# Patient Record
Sex: Male | Born: 1950 | Race: White | Hispanic: No | Marital: Single | Smoking: Never smoker
Health system: Southern US, Community
[De-identification: ages and names within clinical notes are randomized; demographics above are authoritative.]

## PROBLEM LIST (undated history)

## (undated) DIAGNOSIS — Z87442 Personal history of urinary calculi: Secondary | ICD-10-CM

## (undated) DIAGNOSIS — H9190 Unspecified hearing loss, unspecified ear: Secondary | ICD-10-CM

## (undated) DIAGNOSIS — I10 Essential (primary) hypertension: Secondary | ICD-10-CM

## (undated) DIAGNOSIS — G4730 Sleep apnea, unspecified: Secondary | ICD-10-CM

## (undated) DIAGNOSIS — E785 Hyperlipidemia, unspecified: Secondary | ICD-10-CM

## (undated) DIAGNOSIS — T4145XA Adverse effect of unspecified anesthetic, initial encounter: Secondary | ICD-10-CM

## (undated) DIAGNOSIS — M1990 Unspecified osteoarthritis, unspecified site: Secondary | ICD-10-CM

## (undated) DIAGNOSIS — T8859XA Other complications of anesthesia, initial encounter: Secondary | ICD-10-CM

## (undated) HISTORY — PX: HAND SURGERY: SHX662

## (undated) HISTORY — PX: KIDNEY STONE SURGERY: SHX686

---

## 1998-02-03 ENCOUNTER — Emergency Department (HOSPITAL_COMMUNITY): Admission: EM | Admit: 1998-02-03 | Discharge: 1998-02-03 | Payer: Self-pay | Admitting: Emergency Medicine

## 1998-02-15 ENCOUNTER — Ambulatory Visit (HOSPITAL_COMMUNITY): Admission: RE | Admit: 1998-02-15 | Discharge: 1998-02-15 | Payer: Self-pay | Admitting: Urology

## 2002-06-22 ENCOUNTER — Encounter (INDEPENDENT_AMBULATORY_CARE_PROVIDER_SITE_OTHER): Payer: Self-pay | Admitting: *Deleted

## 2002-06-22 ENCOUNTER — Ambulatory Visit (HOSPITAL_COMMUNITY): Admission: RE | Admit: 2002-06-22 | Discharge: 2002-06-22 | Payer: Self-pay | Admitting: Gastroenterology

## 2004-08-09 ENCOUNTER — Inpatient Hospital Stay (HOSPITAL_COMMUNITY): Admission: AD | Admit: 2004-08-09 | Discharge: 2004-08-12 | Payer: Self-pay | Admitting: *Deleted

## 2005-05-30 ENCOUNTER — Encounter: Admission: RE | Admit: 2005-05-30 | Discharge: 2005-05-30 | Payer: Self-pay | Admitting: Internal Medicine

## 2008-11-24 ENCOUNTER — Encounter: Admission: RE | Admit: 2008-11-24 | Discharge: 2008-11-24 | Payer: Self-pay | Admitting: Internal Medicine

## 2008-12-02 ENCOUNTER — Emergency Department (HOSPITAL_BASED_OUTPATIENT_CLINIC_OR_DEPARTMENT_OTHER): Admission: EM | Admit: 2008-12-02 | Discharge: 2008-12-02 | Payer: Self-pay | Admitting: Emergency Medicine

## 2008-12-02 ENCOUNTER — Ambulatory Visit: Payer: Self-pay | Admitting: Radiology

## 2008-12-06 ENCOUNTER — Encounter: Admission: RE | Admit: 2008-12-06 | Discharge: 2008-12-06 | Payer: Self-pay | Admitting: Urology

## 2009-12-04 IMAGING — CR DG ABDOMEN ACUTE W/ 1V CHEST
3 series · 3 of 3 positions shown · non-contrast
Comparison: No comparison chest x-ray.  Comparison abdominal and
pelvic CT 11/24/2008.

CLINICAL DATA: Abdominal pain.  Constipation.

ACUTE ABDOMEN SERIES (ABDOMEN 2 VIEW & CHEST 1 VIEW)

[w chest pa]
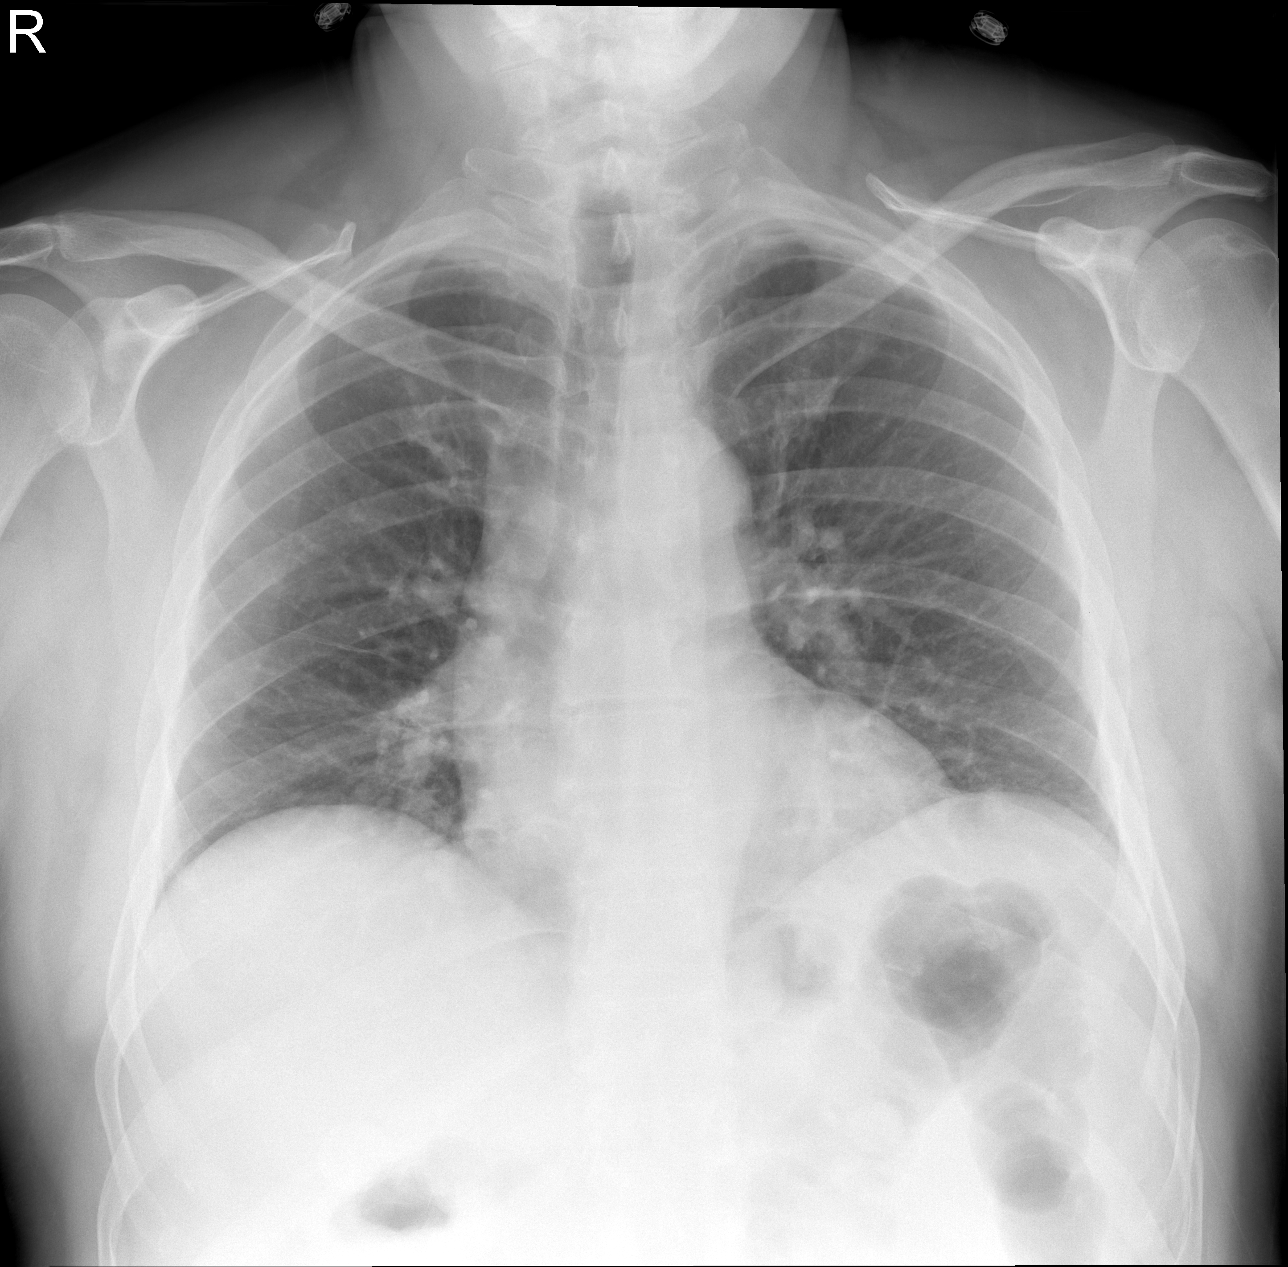

[w abdomen upright]
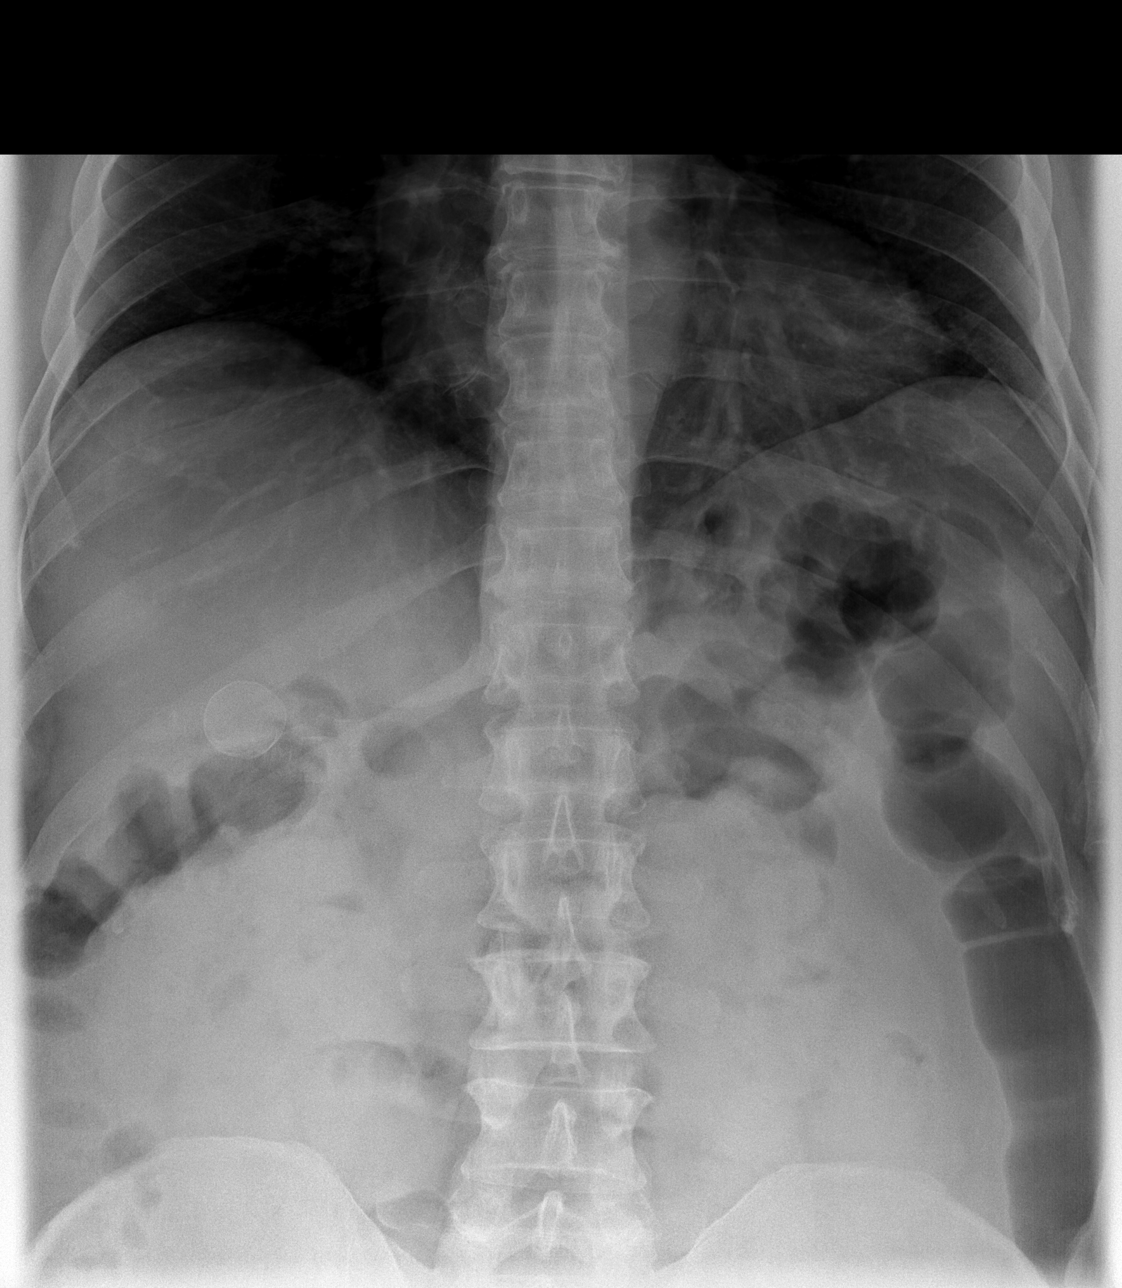

[t abdomen supine]
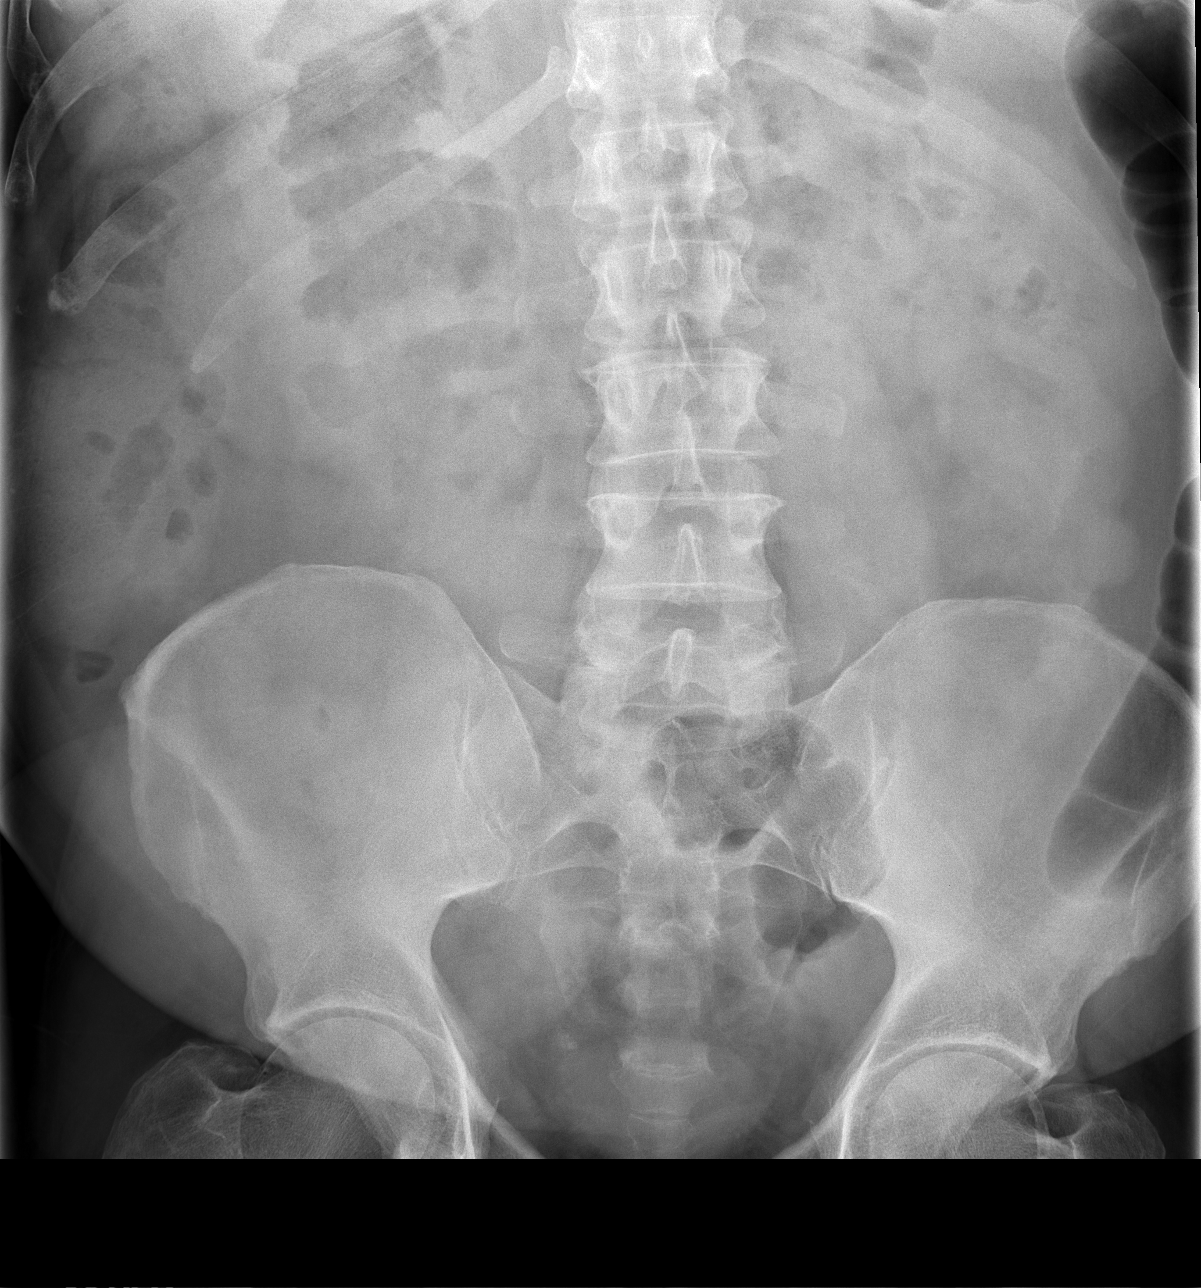

[3 of 3 positions shown; findings below may reference images not displayed]

FINDINGS: Poor inspiration with central pulmonary vascular
congestion.  Mild biapical pleural thickening (slightly greater on
the right) without associated bony destruction.  Stability can be
confirmed on follow-up.  No pneumothorax or segmental region
consolidation.  Heart size top normal.  Mildly tortuous aorta.  No
plain film evidence of mediastinal adenopathy.

2. 7 cm calcification upper quadrant suspicious for gallstone.
Nonspecific bowel gas pattern without plain film evidence of bowel
obstruction or free intraperitoneal air.  The full extent of the
abdomen is not included on the present exam.
IMPRESSION: The full extent of the abdomen is not included on the supine view.
No plain film evidence of obstruction or free intraperitoneal air.

2.7 cm gallstone suspected.

Poor inspiration with mild pulmonary vascular prominence.

Probable scarring lung apices as noted above.

## 2009-12-08 IMAGING — US US ABDOMEN COMPLETE
1 series · 14 of 25 positions shown · non-contrast
Comparison: CT abdomen 11/24/2008

CLINICAL DATA: Right upper quadrant pain, gallstones

ABDOMEN ULTRASOUND
TECHNIQUE: Complete abdominal ultrasound examination was performed
including evaluation of the liver, gallbladder, bile ducts,
pancreas, kidneys, spleen, IVC, and abdominal aorta.

[Series 1: us abdomen complete · 0.35mm/px · 14 of 72 slices shown]
[im 1/72]
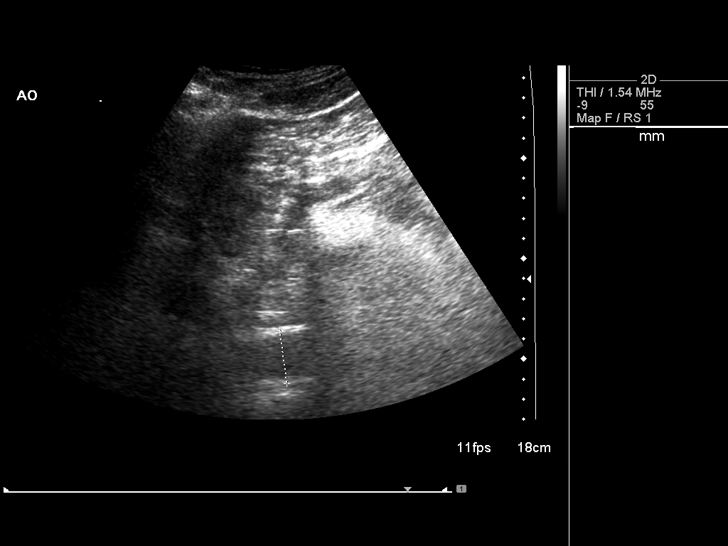
[im 6/72]
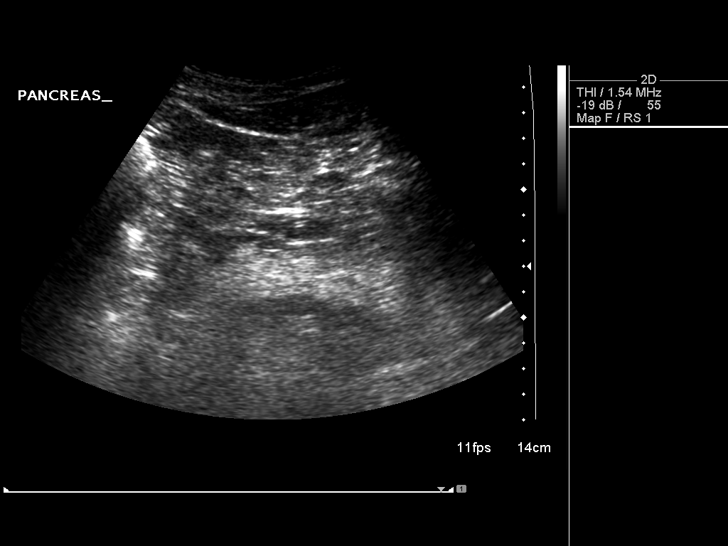
[im 12/72]
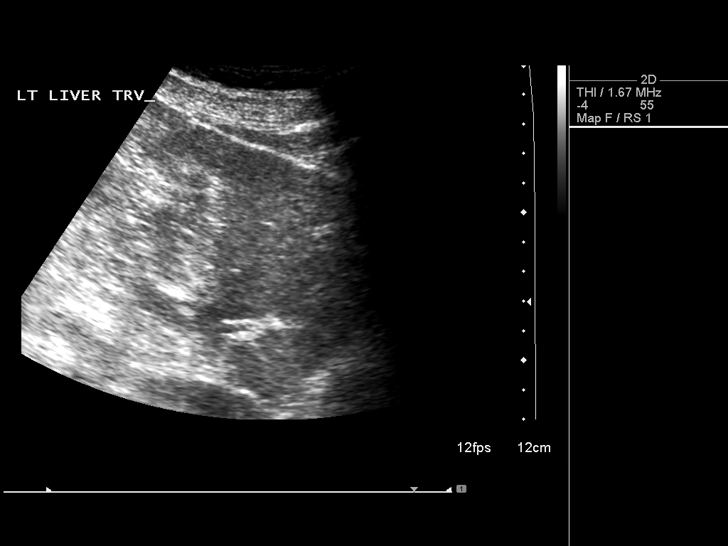
[im 18/72]
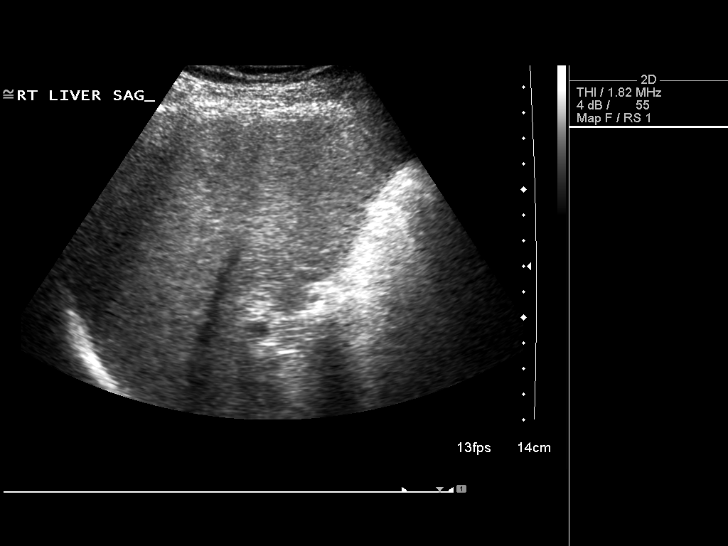
[im 24/72]
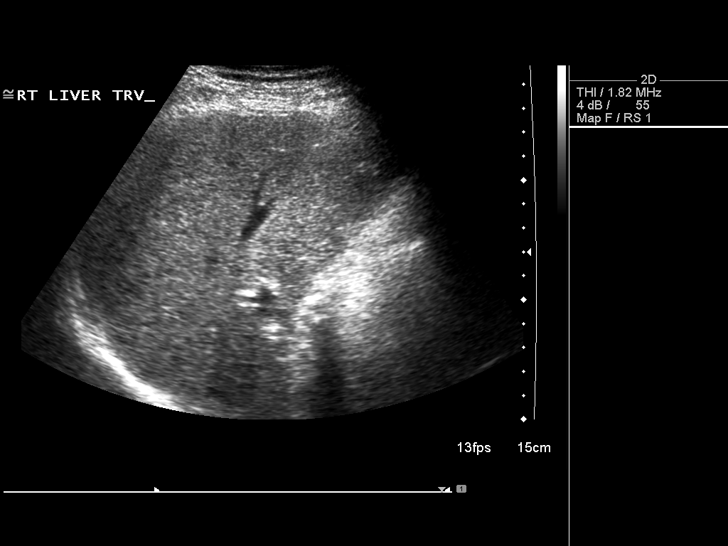
[im 27/72]
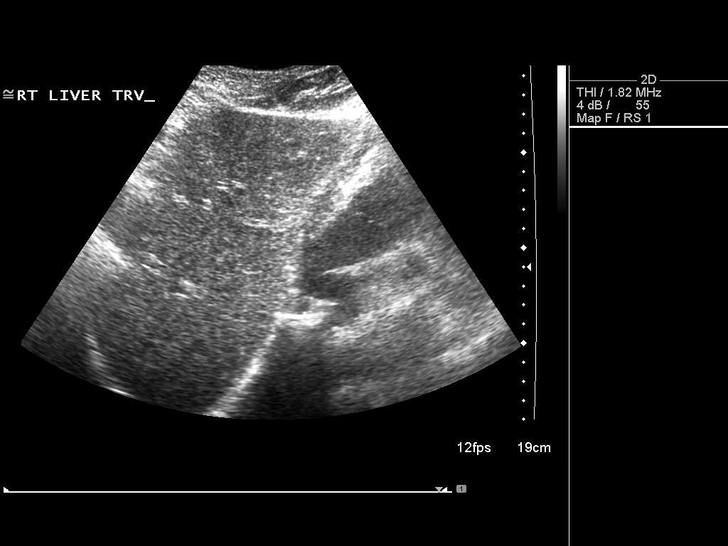
[im 33/72]
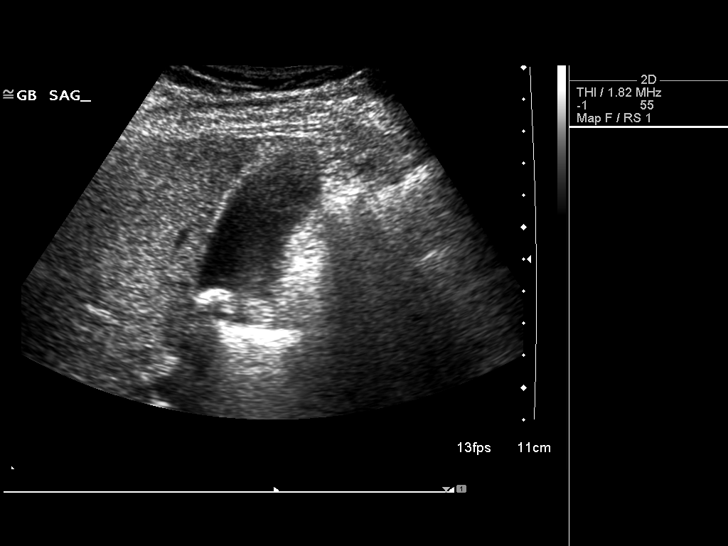
[im 39/72]
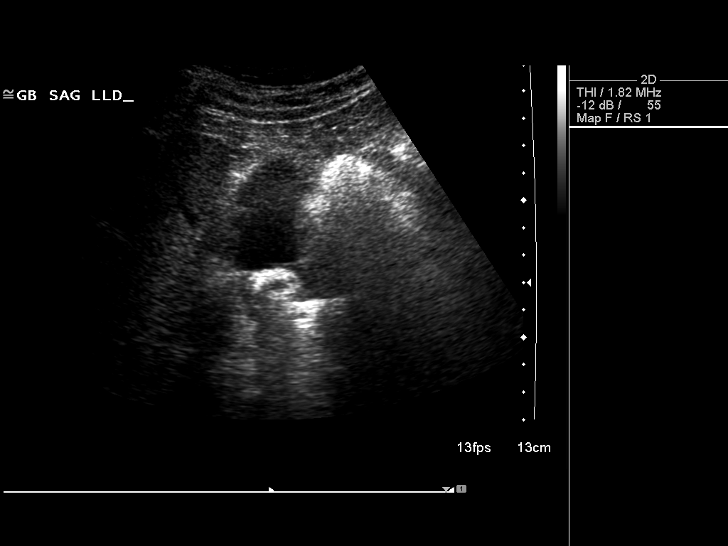
[im 45/72]
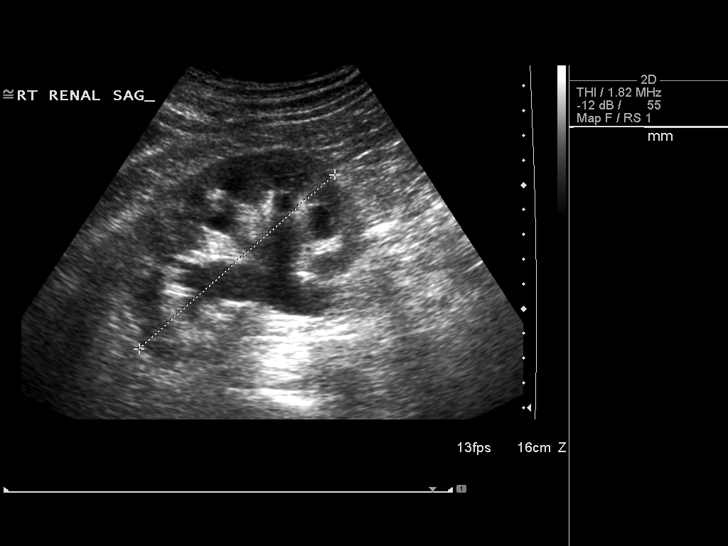
[im 48/72]
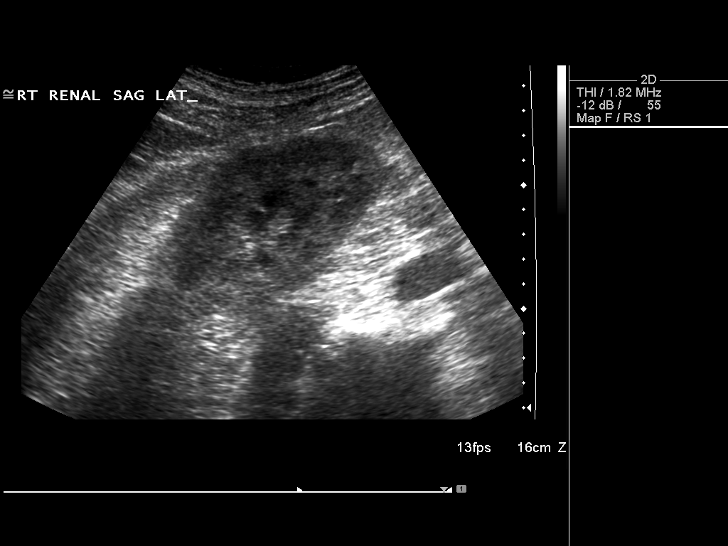
[im 54/72]
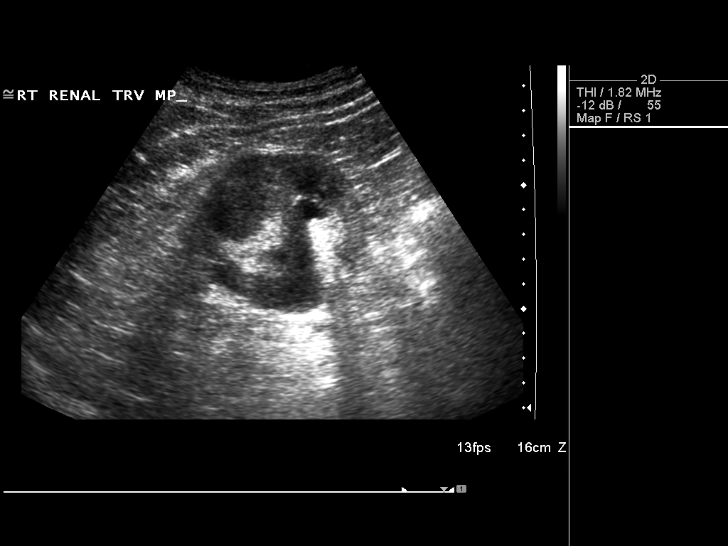
[im 60/72]
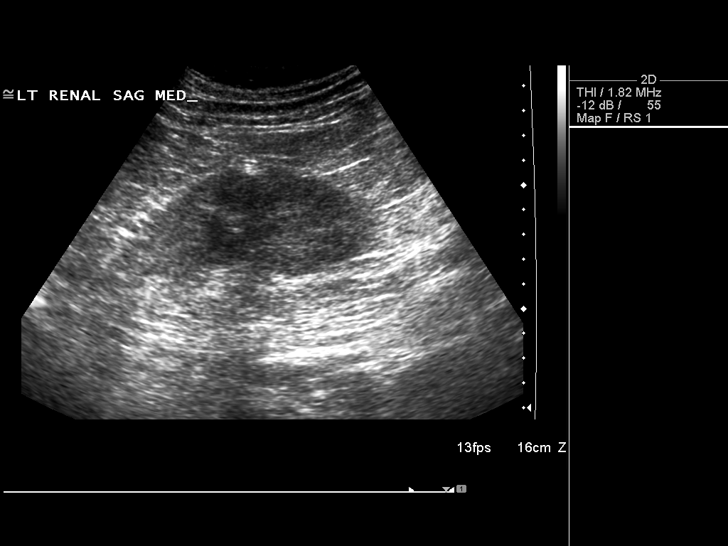
[im 66/72]
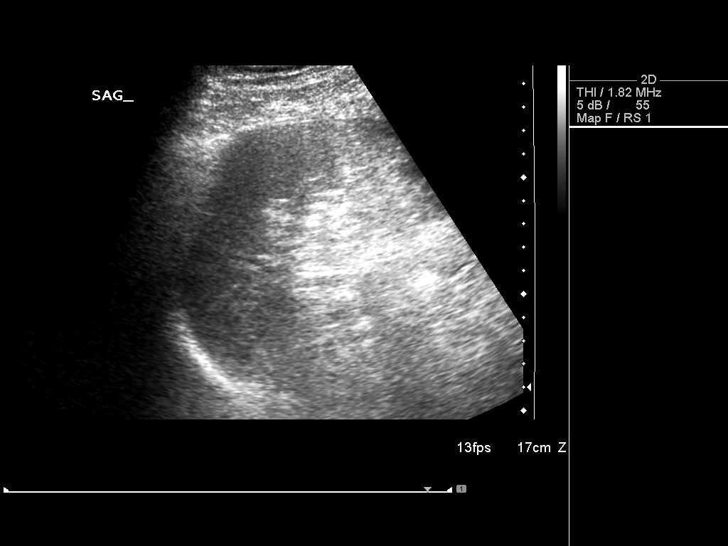
[im 72/72]
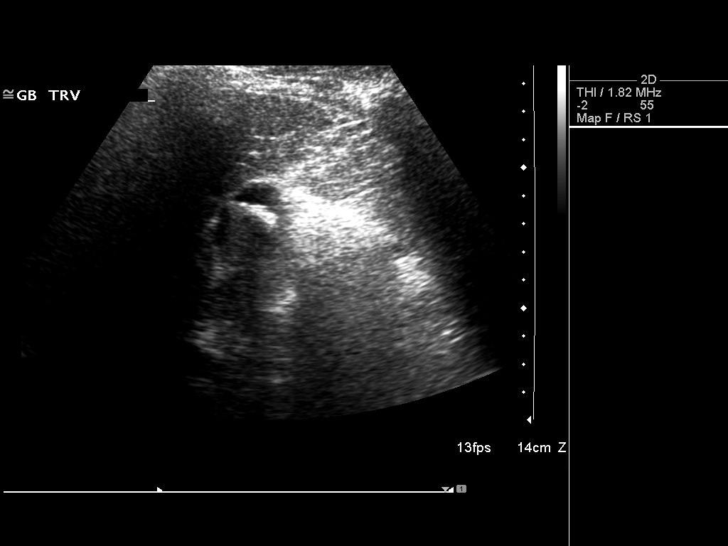

[14 of 25 positions shown; findings below may reference images not displayed]

FINDINGS: The liver is normal and contour and echogenicity.  There
are several (3 to 5) echogenic gallstone within the gallbladder,
the largest measuring 1.2 cm.  No evidence of gallbladder wall
thickening or pericholecystic fluid.  Negative sonographic Murphy's
sign.  Common bile duct is normal at 1.8 mm.

The IVC and pancreas appear normal.

The spleen is normal 5.7 cm.

There is  moderate hydronephrosis of the right kidney which is new.
Right kidney measures 11.8 cm.  No obstructing stone is identified.
The left kidney is non hydronephrotic and measures 12.4 cm.

Abdominal aorta is normal 2.7 cm.
IMPRESSION: 1. Cholelithiasis without evidence of acute cholecystitis.
2.  Hydronephrosis of the right kidney is new from comparison CT of
11/24/2008.  A 3mm right ureteral stone was described on comparison
CT.

REF:G3 DICTATED: 12/06/2008 [DATE]

## 2010-11-24 ENCOUNTER — Encounter: Payer: Self-pay | Admitting: Internal Medicine

## 2011-03-21 NOTE — H&P (Signed)
NAMEJONATHAN, CORPUS               ACCOUNT NO.:  [id number]   MEDICAL RECORD NO.:  [id number]          PATIENT TYPE:  INP   LOCATION:  5703                         FACILITY:  [facility]   PHYSICIAN:  Isidor Holts, M.D.  DATE OF BIRTH:  [birth year]   DATE OF ADMISSION:  08/09/2004  DATE OF DISCHARGE:                                HISTORY & PHYSICAL   PRIMARY PHYSICIAN:  Juline Patch, M.D.   CHIEF COMPLAINT:  Pain, redness, swelling right arm approximately 3 days.   HISTORY OF PRESENT ILLNESS:  As above.   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  In his line of work, he frequently comes in contact with  rusty/dirty cars.  He states he had paper cut right thumb on August 05, 2004  and also nicked his fifth finger of the right hand on the same day.  He  applied Neosporin cream when he noticed some redness in the affected areas.  By August 07, 2004 he noticed pus draining from the fifth finger wound.  He squeezed this and soaked hand in salt water, applied bandage.  Later that  evening his hand started hurting, could not gear shift, then wrist and  forearm started swelling with redness.  He called his PMD on August 08, 2004, was started on p.o. Augmentin.  On the night of August 08, 2004 the  affected arm became very painful, he could not sleep.  He went back to see  his PMD on August 09, 2004 who immediately referred him for admission.   PAST MEDICAL HISTORY:  1.  Hypertension, 7-8 years ago.  2.  Dyslipidemia, 2 years.  3.  Bilateral urolithiasis status post basket extraction about 5 years ago,      occasionally passes stones.   MEDICATION HISTORY:  1.  Augmentin, started August 08, 2004 - now discontinued.  2.  Cardura 8 mg p.o. daily.  3.  Aspirin 81 mg p.o. daily.  4.  Crestor 5 mg alternate days.   ALLERGIES:  No known drug allergies.   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  Denies  alcohol use or drug abuse.  He has four offspring, all of whom are alive and  well.  Mother is age 19 years with dementia, CHF, hypertension.  Father is  deceased at age 69 years with hypertension, heart disease, diabetes  mellitus, and colon cancer.  He had two brothers.  One is deceased from  ruptured abdominal aortic aneurysm.  Another is hypertensive.  He has four  sisters who are alive and well.   REVIEW OF SYMPTOMS:  Essentially as in chief complaint.  Otherwise,  negative.   PHYSICAL EXAMINATION:  VITAL SIGNS:  Temperature 98.2; pulse 76 per minute,  regular; respiratory rate 20; blood pressure 142/78 mmHg.  The patient  appears comfortable and not short of breath at rest.  HEENT:  No clinical pallor, no jaundice, no conjunctival injection.  NECK:  Supple, JVD  not seen.  There is no palpable lymphadenopathy, no  palpable goiter, no carotid bruits.  CHEST:  Clinically clear to auscultation.  No wheezes, no crackles.  HEART:  Heart sounds 1 and 2 are heard, normal rate and rhythm without any  murmurs.  ABDOMEN:  The patient has a small umbilical hernia.  Moderate obesity is  noted.  There is no palpable organomegaly, no palpable masses.  Abdomen is  nontender and bowel sounds are normal.  EXTREMITIES:  Lower extremity exam shows no pitting edema, palpable  peripheral pulses.  Musculoskeletal system full range of motion all joints  without joint swelling, deformity, or tenderness noted.  Left upper  extremity examination is absolutely unremarkable.  Right upper extremity  examination:  The patient has punctate wound dorsum of the right fifth digit  with digital swelling and peripheral erythema.  Right thumb is swollen.  Right hand is swollen with dorsal edema, and he also has lymphangitis of the  right forearm.  He is unable to grip with the right hand.  CENTRAL NERVOUS SYSTEM:  Alert and oriented x3.  No focal  neurologic deficit  noted on examination.   Investigations are still pending; however, CBC, CMP, and blood cultures have  been ordered.   ASSESSMENT AND PLAN:  1.  Cellulitis with lymphangitis right arm.  The main concern is whether or      not this patient has also developed a right hand abscess.  We shall      admit to general medical floor; start intravenous Zosyn and analgesics;      do an x-ray to rule out bony involvement; do CBC, blood cultures,      comprehensive metabolic panel; elevate his hand; and call hand surgery      on consult.  2.  Hypertension.  This seems under fairly good control.  Will continue his      preadmission medications and monitor.  3.  History of dyslipidemia.  Will hold Crestor for now and do fasting      lipids in the morning.   Further management will depend on clinical course.      Chri   CO/MEDQ  D:  08/09/2004  T:  08/09/2004  Job:  [id number]   cc:   Juline Patch, M.D.  [address]  [place], [place] [address]  Fax: [phone]

## 2011-03-21 NOTE — Discharge Summary (Signed)
[name], [name]               ACCOUNT NO.:  [id number]   MEDICAL RECORD NO.:  [id number]          PATIENT TYPE:  INP   LOCATION:  5741                         FACILITY:  [facility]   PHYSICIAN:  Mallory Shirk, MD     DATE OF BIRTH:  1951-09-08   DATE OF ADMISSION:  08/09/2004  DATE OF DISCHARGE:  08/12/2004                                 DISCHARGE SUMMARY   CHIEF COMPLAINT:  Pain/redness/swelling of right arm, approximately 3 days.   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  He had a paper cut on his right thumb, August 04, 2004,  also nicked the finger of the right hand on the same day.  He applied some  Neosporin cream and he noted some redness in the affected areas.  By August 07, 2004, he had noticed pus draining from the little finger wound.  He  squeezed this and soaked the hand in salt water, and applied a bandage.  Later that evening, his hand started hurting, could not drive his car and  could not shift the gears, and then his wrist and forearm started swelling  with associated redness.  He called his PCP, Dr. Juline Patch, on August 08, 2004, was started on p.o. Augmentin on the night of June 08, 2004.  The  affected arm became very painful and he could not sleep.  He went back to  see his PMD on August 09, 2004, who then referred him for admission.   PAST MEDICAL HISTORY:   MEDICAL:  1.  Hypertension.  2.  Dyslipidemia.  3.  Bilateral urolithiasis, status post basket extraction.   MEDICATIONS:  1.  Cardura 8 mg p.o. daily.  2.  Aspirin 81 mg p.o. daily.  3.  Crestor 5 mg p.o. every other day.   PHYSICAL EXAM ON ADMISSION:  VITAL SIGNS:  Temperature 98.2, pulse 76,  respirations 20, blood pressure 142/78.  GENERAL:  The patient appeared comfortable, in no acute discomfort.  HEENT:  Normocephalic, atraumatic.  PERRL.  Sclerae  anicteric.  NECK:  Neck supple and no JVD, no lymphadenopathy.  CVS:  S1 plus S2, regular rate and rhythm; no murmurs, rubs, or gallops.  LUNGS:  Lungs clear to auscultation bilaterally.  ABDOMEN:  Small midline umbilical hernia, moderate obesity noted, no  palpable organomegaly, positive bowel sounds, no tenderness.  EXTREMITIES:  Lower extremities:  No pitting edema.  Palpable peripheral  pulses.  Full range of motion in musculoskeletal system, except left upper  extremity which showed a punctate wound on the dorsum of the right 5th digit  with digital swelling, peripheral erythema.  The right thumb was swollen  with dorsal edema, also appeared to have lymphangitis of the right forearm,  unable to grip with the right forearm.  NEUROLOGIC:  Alert and oriented x3, no focal deficits.   LABORATORY DATA:  Labs on day of admission:  WBC 18.7, hemoglobin 13.6,  hematocrit 38.6, platelets 236,000.  BUN 132, potassium 3.8, chloride 98,  carbon  dioxide 26, glucose 149, BUN 14, creatinine 1.0, total bilirubin 1.3,  alkaline phosphatase 68, AST 14, ALT 16, total protein 6.8, albumin 3.4,  calcium 8.8, serum uric acid 4.6.  Lipid profile;  Cholesterol 106,  triglycerides 85, HDL cholesterol 37, LDL cholesterol 52.   IMAGING:  Plain films of the right upper extremity show:  #1 - Diffuse soft  tissue swelling and no radiographic evidence of osteomyelitis, #2 - mild  osteoarthritis noted.   HOSPITAL COURSE:  The patient was admitted to the floor.  He was treated  with IV vancomycin.  He was also started with Zosyn, which was subsequently  discontinued and Ancef was started.  He was seen by orthopedics and  underwent an I&D on his flexor sheath/palm, ulnar bursa at the wrist.  An  irrigation catheter drain was also placed.  The patient tolerated the  procedure well.  On postop day #1, his pain had decreased.  His swelling had  gone down on postop day #2, which was also the day of discharge.  The   patient's pain had significantly decreased and the swelling had also  decreased.  The patient was evaluated by orthopedics and recommended that we  discharge him.  The patient was discharged in stable condition, afebrile,  vital signs stable.   MEDICATIONS ON DISCHARGE:  1.  Augmentin 875 mg p.o. b.i.d. x10 days.  2.  Percocet 1-2 tablets p.o. q.4-6 h. p.r.n. pain.  3.  Advil/Aleve for pain.  4.  Cardura 8 mg p.o. daily.  5.  Aspirin 81 mg p.o. daily.  6.  Crestor 5 mg p.o. every other day.   FOLLOWUP APPOINTMENTS:  1.  With orthopedics on Wednesday, August 14, 2004, for dressing change.  2.  With primary care doctor, Dr. Juline Patch, within 2 weeks of discharge.      The patient will call and make the appointments for Dr. Ricki Miller.   ACTIVITY:  The patient was advised to limit his activities on an as-  tolerated basis.   SPECIAL DISCHARGE INSTRUCTIONS:  The patient was also advised to return to  the emergency room immediately upon onset of fever, shortness of breath,  pain in his right extremity, drainage from his right extremity incision or  any other symptom that may need immediate particular attention.       GDK/MEDQ  D:  08/12/2004  T:  08/13/2004  Job:  [id number]   cc:   Juline Patch, M.D.  [address]  [place], [place] [address]  Fax: [phone]

## 2011-03-21 NOTE — Op Note (Signed)
NAMEJERRAL, MCCAULEY               ACCOUNT NO.:  [id number]   MEDICAL RECORD NO.:  [id number]          PATIENT TYPE:  INP   LOCATION:  5741                         FACILITY:  [facility]   PHYSICIAN:  Katy Fitch. Sypher Montez Hageman., M.D.DATE OF BIRTH:  12-14-1950   DATE OF PROCEDURE:  DATE OF DISCHARGE:                                 OPERATIVE REPORT   DATE OF OPERATION:  August 10, 2004.   PREOPERATIVE DIAGNOSES:  Cellulitis, right hand, wrist, and forearm, status  post skin injury, dorsal aspect, right small finger DIP joint sustained  August 05, 2004, with a progressive cellulitis unresponsive to oral  antibiotic therapy including Augmentin 875 mg p.o. b.i.d. followed by poor  response to IV Vancomycin and Zosyn.  Rule out palmar space abscess and  ulnar bursa abscess due to septic flexor tenosynovitis of small finger.   POSTOPERATIVE DIAGNOSES:  1.  Cellulitis, right hand, wrist, and forearm, status post skin injury,      dorsal aspect, right small finger DIP joint sustained August 05, 2004,      with a progressive cellulitis unresponsive to oral antibiotic therapy      including Augmentin 875 mg p.o. b.i.d. followed by poor response to IV      Vancomycin and Zosyn.  Rule out palmar space abscess and ulnar bursa      abscess due to septic flexor tenosynovitis of small finger.  2.  Confirmation of palmar space abscess and ulnar bursa abscess with septic      flexor tenosynovitis demonstrated in ring and small finger flexor      sheaths.   OPERATION:  1.  Incision and drainage of right carpal canal and palmar space and ulnar      bursa with aerobic and anaerobic cultures.  2.  Incision and drainage of right small finger flexor sheath with through-      and-through irrigation with triple antibiotic solution utilizing  a #5      pediatric feeding tube.  3.  Incision and drainage of right ring finger flexor sheath with through-      and-through irrigation utilizing a #5 pediatric feeding tube  and triple      antibiotic solution followed by placement of vessel loop Silicone drains      along the flexor tendons of the ring and small finger through the palmar      space.   SURGEON:  Katy Fitch. Sypher, MD.   ASSISTANT:  None.   ANESTHESIA:  General by LMA.   SUPERVISING ANESTHESIOLOGIST:  Bedelia Person, MD.   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   On August 05, 2004, he sustained a cutaneous injury to the dorsal aspect of  his right small finger and another cutaneous injury on the palmar surface of  his right thumb.   He initially developed some rubor and swelling and began treating his hand  with saline soaks.  He presented for evaluation with his primary care  physician, Dr. Ricki Miller, on August 07, 2004, and was placed on  oral Augmentin.  He did not respond to oral antibiotic therapy and was seen back for  evaluation and management and ultimately admitted to [facility] early on  the morning of August 09, 2004, to the [facility].   He was noted to have a swollen right hand with volar forearm pain and  evidence of ascending cellulitis and lymphangitis.   Dr. Ricki Miller started IV Zosyn and requested a hand surgery consult.   Clinical examination initially showed diffuse cellulitis without evidence of  abscess formation.  Mr. Hylton initially had partial range of motion of his  fingers, thumb, and wrist.   He was followed closely for 48 hours and by his fifth clinical examination  was noted to have localizing symptoms in the small finger flexor sheath,  ring finger flexor sheath, and swelling along the ulnar aspect of his wrist  adjacent to the ulnar bursa consistent with a septic tenosynovitis.   Failing IV antibiotic therapy, I recommended proceeding with incision and  drainage at this time.   Serial examination had revealed progressive loss of sensibility in the  median  distribution confirming the increase in tension in the mid palmar  space and carpal canal.   After informed consent, he is brought to the operating room at this time.   PROCEDURE:  Cobi Delph is brought to the operating room and placed in the  supine position upon the operating table.   Following the induction of general anesthesia by LMA, the right arm was  prepped with Betadine soap and solution and sterilely draped.  A pneumatic  tourniquet was applied to the proximal brachium.   Following exsanguination initially by elevation, the arterial tourniquet was  inflated to 250 mmHg.  Procedure commenced with a carpal tunnel incision  paralleling the thenar crease, extended across the volar wrist flexion  creases with a Bruner's zig-zag incision.   Subcutaneous tissues revealed considerable edema.  The volar forearm fascia  was identified, and it was noted to be tense due to swelling of the flexor  tenosynovium.  The volar forearm fascia was released along the ulnar aspect  of the carpal canal and in the volar forearm exposing the ulnar bursa.  No  immediate collection of purulent material was noted.   Proximal dissection revealed no collections of pus in Parona's space.  Distal dissection with release of the transverse carpal ligament under  direct vision revealed a tense mid-palmar space, which immediately upon  entry produced a large quantity of turbid fluid consistent with  Streptococcal infection or mixed infection.   This was immediately cultured for aerobic and anaerobic growth.   The mid-palmar space was palpated digitally, and a blunt Glorious Peach was used to  break up adhesions followed by copious irrigation with triple antibiotic  solution.   The small finger flexor sheath was then exposed with a small, oblique  incision directly over the A-1 pulley.  The proximal portion of the A-1  pulley was released, and a #5 pediatric feeding tube was threaded distally deep to the A-2  pulley, and a second incision was fashioned at the DIP  flexion crease utilizing Bruner incision lines.  The interval between the A-  5 and A-4 pulleys was released, and through-and-through irrigation with a #5  pediatric feeding tube and triple antibiotic solution yielded a considerable  amount of fibrinous debris. Frank pus was not encountered; however, turbid  fluid was clearly evident.   Attention was then directed to the ring finger where an oblique incision  was  fashioned over the A-1 pulley.  Once again, the proximal margin of the A-1  pulley was released followed by threading of a pediatric feeding tube, and  distal drainage through an incision between the A-4 and A-5 pulleys.  Through-and-through irrigation once again cleared turbid fluid and fibrin.   The long finger did not appear to have significant distention of the flexor  sheath.   A Glorious Peach was then used to enter the palmar space and track along the path of  the flexor tendons to the long, ring, and small fingers to make sure no  areas of undrained, purulent material remained.   Thus, the loop drains were passed with a Carroll tendon passing forceps from  the level of the A-1 pulley into the palmar space for the ring and small  fingers to ensure drainage.   The wound was finally irrigated thoroughly with triple antibiotic solution  followed by partial closure of the volar forearm wound with intradermal 3-0  Prolene and palmar intradermal 3-0 Prolene that will be closed secondarily.  The finger incisions were left open.   The wounds were dressed with Adaptic, sterile gauze, sterile Kerlix, sterile  Webril, and a volar plaster splint immobilizing the wrist in neutral.  There  were no apparent complications with respect to Mr. Skillin.   A PS-2 needle stick occurred into my right thumb during closure.   We will ask for the routine protocol of needle stick precautions with  appropriate serologies following the [facility]  protocol for such incidents.   Otherwise, there were no incidents.       RVS/MEDQ  D:  08/10/2004  T:  08/11/2004  Job:  [id number]   cc:   Trevor Iha, Dr.   2 copies to Dr. Stark Jock office

## 2011-03-21 NOTE — Op Note (Signed)
   NAME:  [name], [name]                         ACCOUNT NO.:  [id number]   MEDICAL RECORD NO.:  [id number]                   PATIENT TYPE:  AMB   LOCATION:  ENDO                                 FACILITY:  [facility]   PHYSICIAN:  Charna Elizabeth, M.D.                   DATE OF BIRTH:  [birth year]   DATE OF PROCEDURE:  06/22/2002  DATE OF DISCHARGE:                                 OPERATIVE REPORT   PROCEDURE PERFORMED:  Colonoscopy with snare polypectomy x1.   ENDOSCOPIST:  Charna Elizabeth, M.D.   INSTRUMENT USED:  Olympus video colonoscope.   INDICATIONS FOR PROCEDURE:  The patient is a 60 year old white male with a  history of colon cancer in his father, rule out colonic polyps, masses,  hemorrhoids, etc.   PRE-PROCEDURE PREPARATION:  Informed consent was secured from the patient.  The patient fasted for eight hours prior to the procedure and prepped with a  bottle of magnesium citrate and 1 gallon of __________ the night prior to  the procedure.   PRE-PROCEDURE PHYSICAL EXAMINATION:  The patient had stable vital signs.  Neck was supple.  Chest was clear to auscultation.  S1 and S2 were regular.  Abdomen was soft with normal bowel sounds.   DESCRIPTION OF PROCEDURE:  The patient was placed in the left lateral  decubitus position and sedated with 70 mg of Demerol and 7 mg of Versed  intravenously.  Once the patient was adequately sedated, maintained on low-  flow oxygen and continuous cardiac monitoring, the Olympus video colonoscope  was advanced from the rectum to the cecum without difficulty except for a  small sessile polyp snared from 10 cm.  No other masses or polyps were seen  in the colon.  The procedure was concluded up to the cecum.  The appendiceal  orifice and the ileocecal valve were clearly visualized and photographed.  There was no evidence of diverticulosis, ulcers, or erosions.  Small  internal hemorrhoids were seen on retroflexion.   IMPRESSION:  1. Small polyp snared  at 10 cm.  2. Small, non-bleeding internal hemorrhoids.  3. Otherwise normal-appearing proximal left colon, transverse colon, right     colon and cecum.    RECOMMENDATIONS:  1. Await pathology results.  2. Avoid all non-steroidals for the next two weeks.  3. Outpatient followup in the next two weeks.                                               Charna Elizabeth, M.D.    JM/MEDQ  D:  06/22/2002  T:  06/25/2002  Job:  [id number]   cc:   Christiana Fuchs, MD  [address]  [place], [place] [address]  Fax: [phone]

## 2015-09-11 DIAGNOSIS — I10 Essential (primary) hypertension: Secondary | ICD-10-CM | POA: Diagnosis not present

## 2015-09-11 DIAGNOSIS — E782 Mixed hyperlipidemia: Secondary | ICD-10-CM | POA: Diagnosis not present

## 2015-09-18 DIAGNOSIS — Z23 Encounter for immunization: Secondary | ICD-10-CM | POA: Diagnosis not present

## 2015-09-18 DIAGNOSIS — G4733 Obstructive sleep apnea (adult) (pediatric): Secondary | ICD-10-CM | POA: Diagnosis not present

## 2015-09-18 DIAGNOSIS — I10 Essential (primary) hypertension: Secondary | ICD-10-CM | POA: Diagnosis not present

## 2015-09-18 DIAGNOSIS — E782 Mixed hyperlipidemia: Secondary | ICD-10-CM | POA: Diagnosis not present

## 2016-04-01 DIAGNOSIS — I10 Essential (primary) hypertension: Secondary | ICD-10-CM | POA: Diagnosis not present

## 2016-04-01 DIAGNOSIS — E782 Mixed hyperlipidemia: Secondary | ICD-10-CM | POA: Diagnosis not present

## 2016-04-01 DIAGNOSIS — G4733 Obstructive sleep apnea (adult) (pediatric): Secondary | ICD-10-CM | POA: Diagnosis not present

## 2016-04-01 DIAGNOSIS — Z125 Encounter for screening for malignant neoplasm of prostate: Secondary | ICD-10-CM | POA: Diagnosis not present

## 2016-04-02 DIAGNOSIS — Z0000 Encounter for general adult medical examination without abnormal findings: Secondary | ICD-10-CM | POA: Diagnosis not present

## 2016-04-08 DIAGNOSIS — G4733 Obstructive sleep apnea (adult) (pediatric): Secondary | ICD-10-CM | POA: Diagnosis not present

## 2016-04-08 DIAGNOSIS — R7303 Prediabetes: Secondary | ICD-10-CM | POA: Diagnosis not present

## 2016-04-08 DIAGNOSIS — I10 Essential (primary) hypertension: Secondary | ICD-10-CM | POA: Diagnosis not present

## 2016-04-08 DIAGNOSIS — E782 Mixed hyperlipidemia: Secondary | ICD-10-CM | POA: Diagnosis not present

## 2016-07-10 DIAGNOSIS — J329 Chronic sinusitis, unspecified: Secondary | ICD-10-CM | POA: Diagnosis not present

## 2016-07-10 DIAGNOSIS — Z23 Encounter for immunization: Secondary | ICD-10-CM | POA: Diagnosis not present

## 2016-07-22 DIAGNOSIS — J343 Hypertrophy of nasal turbinates: Secondary | ICD-10-CM | POA: Diagnosis not present

## 2016-07-22 DIAGNOSIS — J310 Chronic rhinitis: Secondary | ICD-10-CM | POA: Diagnosis not present

## 2016-07-22 DIAGNOSIS — J342 Deviated nasal septum: Secondary | ICD-10-CM | POA: Diagnosis not present

## 2016-08-13 ENCOUNTER — Other Ambulatory Visit (INDEPENDENT_AMBULATORY_CARE_PROVIDER_SITE_OTHER): Payer: Self-pay | Admitting: Otolaryngology

## 2016-08-13 DIAGNOSIS — J343 Hypertrophy of nasal turbinates: Secondary | ICD-10-CM | POA: Diagnosis not present

## 2016-08-13 DIAGNOSIS — J330 Polyp of nasal cavity: Secondary | ICD-10-CM | POA: Diagnosis not present

## 2016-08-13 DIAGNOSIS — J339 Nasal polyp, unspecified: Secondary | ICD-10-CM

## 2016-08-13 DIAGNOSIS — J310 Chronic rhinitis: Secondary | ICD-10-CM | POA: Diagnosis not present

## 2016-08-15 ENCOUNTER — Ambulatory Visit
Admission: RE | Admit: 2016-08-15 | Discharge: 2016-08-15 | Disposition: A | Discharge status: Home / Self Care | Payer: Medicare Other | Source: Ambulatory Visit | Attending: Otolaryngology | Admitting: Otolaryngology

## 2016-08-15 DIAGNOSIS — J3489 Other specified disorders of nose and nasal sinuses: Secondary | ICD-10-CM | POA: Diagnosis not present

## 2016-08-15 DIAGNOSIS — J339 Nasal polyp, unspecified: Secondary | ICD-10-CM

## 2016-09-03 DIAGNOSIS — J330 Polyp of nasal cavity: Secondary | ICD-10-CM | POA: Diagnosis not present

## 2016-09-03 DIAGNOSIS — J343 Hypertrophy of nasal turbinates: Secondary | ICD-10-CM | POA: Diagnosis not present

## 2016-09-03 DIAGNOSIS — J310 Chronic rhinitis: Secondary | ICD-10-CM | POA: Diagnosis not present

## 2016-09-12 ENCOUNTER — Other Ambulatory Visit: Payer: Self-pay | Admitting: Otolaryngology

## 2016-09-22 ENCOUNTER — Encounter (HOSPITAL_BASED_OUTPATIENT_CLINIC_OR_DEPARTMENT_OTHER): Payer: Self-pay | Admitting: *Deleted

## 2016-09-22 NOTE — Progress Notes (Signed)
   09/22/16 1532  OBSTRUCTIVE SLEEP APNEA  Have you ever been diagnosed with sleep apnea through a sleep study? Yes  If yes, do you have and use a CPAP or BPAP machine every night? 1  Do you know the presssure settings on your maching? Yes  Do you snore loudly (loud enough to be heard through closed doors)?  1  Do you often feel tired, fatigued, or sleepy during the daytime (such as falling asleep during driving or talking to someone)? 0  Has anyone observed you stop breathing during your sleep? 0  Do you have, or are you being treated for high blood pressure? 1  BMI more than 35 kg/m2? 0  Age > 50 (1-yes) 1  Neck circumference greater than:Male 16 inches or larger, Male 17inches or larger? 0  Male Gender (Yes=1) 1  Obstructive Sleep Apnea Score 4

## 2016-09-23 ENCOUNTER — Other Ambulatory Visit: Payer: Self-pay

## 2016-09-23 ENCOUNTER — Encounter (HOSPITAL_BASED_OUTPATIENT_CLINIC_OR_DEPARTMENT_OTHER)
Admission: RE | Admit: 2016-09-23 | Discharge: 2016-09-23 | Disposition: A | Discharge status: Home / Self Care | Payer: Medicare Other | Source: Ambulatory Visit | Attending: Otolaryngology | Admitting: Otolaryngology

## 2016-09-23 DIAGNOSIS — J310 Chronic rhinitis: Secondary | ICD-10-CM | POA: Diagnosis not present

## 2016-09-23 DIAGNOSIS — I10 Essential (primary) hypertension: Secondary | ICD-10-CM | POA: Insufficient documentation

## 2016-09-23 DIAGNOSIS — G4730 Sleep apnea, unspecified: Secondary | ICD-10-CM | POA: Diagnosis not present

## 2016-09-23 DIAGNOSIS — Z6834 Body mass index (BMI) 34.0-34.9, adult: Secondary | ICD-10-CM | POA: Diagnosis not present

## 2016-09-23 DIAGNOSIS — G4733 Obstructive sleep apnea (adult) (pediatric): Secondary | ICD-10-CM | POA: Insufficient documentation

## 2016-09-23 DIAGNOSIS — J338 Other polyp of sinus: Secondary | ICD-10-CM | POA: Diagnosis not present

## 2016-09-23 DIAGNOSIS — Z7982 Long term (current) use of aspirin: Secondary | ICD-10-CM | POA: Diagnosis not present

## 2016-09-23 DIAGNOSIS — E669 Obesity, unspecified: Secondary | ICD-10-CM | POA: Diagnosis not present

## 2016-09-23 DIAGNOSIS — M1990 Unspecified osteoarthritis, unspecified site: Secondary | ICD-10-CM | POA: Diagnosis not present

## 2016-09-23 DIAGNOSIS — J343 Hypertrophy of nasal turbinates: Secondary | ICD-10-CM | POA: Diagnosis not present

## 2016-09-23 DIAGNOSIS — J3489 Other specified disorders of nose and nasal sinuses: Secondary | ICD-10-CM | POA: Diagnosis not present

## 2016-09-23 LAB — BASIC METABOLIC PANEL
Anion gap: 8 (ref 5–15)
BUN: 14 mg/dL (ref 6–20)
CO2: 28 mmol/L (ref 22–32)
Calcium: 9.8 mg/dL (ref 8.9–10.3)
Chloride: 102 mmol/L (ref 101–111)
Creatinine, Ser: 1.09 mg/dL (ref 0.61–1.24)
GFR calc Af Amer: >60 mL/min (ref >60)
GFR calc non Af Amer: >60 mL/min (ref >60)
Glucose, Bld: 121 mg/dL — ABNORMAL HIGH (ref 65–99)
Potassium: 3.8 mmol/L (ref 3.5–5.1)
Sodium: 138 mmol/L (ref 135–145)

## 2016-09-30 ENCOUNTER — Encounter (HOSPITAL_BASED_OUTPATIENT_CLINIC_OR_DEPARTMENT_OTHER): Payer: Self-pay | Admitting: Anesthesiology

## 2016-09-30 ENCOUNTER — Ambulatory Visit (HOSPITAL_BASED_OUTPATIENT_CLINIC_OR_DEPARTMENT_OTHER)
Admission: RE | Admit: 2016-09-30 | Discharge: 2016-09-30 | Disposition: A | Discharge status: Home / Self Care | Payer: Medicare Other | Source: Ambulatory Visit | Attending: Otolaryngology | Admitting: Otolaryngology

## 2016-09-30 ENCOUNTER — Encounter (HOSPITAL_BASED_OUTPATIENT_CLINIC_OR_DEPARTMENT_OTHER)
Admission: RE | Disposition: A | Discharge status: Home / Self Care | Payer: Self-pay | Source: Ambulatory Visit | Attending: Otolaryngology

## 2016-09-30 ENCOUNTER — Ambulatory Visit (HOSPITAL_BASED_OUTPATIENT_CLINIC_OR_DEPARTMENT_OTHER): Payer: Medicare Other | Admitting: Anesthesiology

## 2016-09-30 DIAGNOSIS — J3489 Other specified disorders of nose and nasal sinuses: Secondary | ICD-10-CM | POA: Insufficient documentation

## 2016-09-30 DIAGNOSIS — Z7982 Long term (current) use of aspirin: Secondary | ICD-10-CM | POA: Diagnosis not present

## 2016-09-30 DIAGNOSIS — G4730 Sleep apnea, unspecified: Secondary | ICD-10-CM | POA: Insufficient documentation

## 2016-09-30 DIAGNOSIS — J338 Other polyp of sinus: Secondary | ICD-10-CM | POA: Diagnosis not present

## 2016-09-30 DIAGNOSIS — E669 Obesity, unspecified: Secondary | ICD-10-CM | POA: Insufficient documentation

## 2016-09-30 DIAGNOSIS — Z6834 Body mass index (BMI) 34.0-34.9, adult: Secondary | ICD-10-CM | POA: Insufficient documentation

## 2016-09-30 DIAGNOSIS — J330 Polyp of nasal cavity: Secondary | ICD-10-CM | POA: Diagnosis not present

## 2016-09-30 DIAGNOSIS — M1990 Unspecified osteoarthritis, unspecified site: Secondary | ICD-10-CM | POA: Insufficient documentation

## 2016-09-30 DIAGNOSIS — J310 Chronic rhinitis: Secondary | ICD-10-CM | POA: Diagnosis not present

## 2016-09-30 DIAGNOSIS — J343 Hypertrophy of nasal turbinates: Secondary | ICD-10-CM | POA: Diagnosis not present

## 2016-09-30 DIAGNOSIS — J339 Nasal polyp, unspecified: Secondary | ICD-10-CM | POA: Diagnosis not present

## 2016-09-30 DIAGNOSIS — I10 Essential (primary) hypertension: Secondary | ICD-10-CM | POA: Diagnosis not present

## 2016-09-30 HISTORY — DX: Unspecified hearing loss, unspecified ear: H91.90

## 2016-09-30 HISTORY — DX: Sleep apnea, unspecified: G47.30

## 2016-09-30 HISTORY — DX: Essential (primary) hypertension: I10

## 2016-09-30 HISTORY — PX: POLYPECTOMY: SHX149

## 2016-09-30 HISTORY — DX: Unspecified osteoarthritis, unspecified site: M19.90

## 2016-09-30 HISTORY — DX: Hyperlipidemia, unspecified: E78.5

## 2016-09-30 HISTORY — DX: Personal history of urinary calculi: Z87.442

## 2016-09-30 HISTORY — PX: NASAL SEPTOPLASTY W/ TURBINOPLASTY: SHX2070

## 2016-09-30 HISTORY — DX: Adverse effect of unspecified anesthetic, initial encounter: T41.45XA

## 2016-09-30 HISTORY — DX: Other complications of anesthesia, initial encounter: T88.59XA

## 2016-09-30 SURGERY — SEPTOPLASTY, NOSE, WITH NASAL TURBINATE REDUCTION
Anesthesia: General | Site: Nose | Laterality: Right

## 2016-09-30 MED ORDER — FENTANYL CITRATE (PF) 100 MCG/2ML IJ SOLN
INTRAMUSCULAR | Status: AC
  Filled 2016-09-30: qty 2

## 2016-09-30 MED ORDER — DEXAMETHASONE SODIUM PHOSPHATE 10 MG/ML IJ SOLN
INTRAMUSCULAR | Status: AC
  Filled 2016-09-30: qty 1

## 2016-09-30 MED ORDER — PROPOFOL 10 MG/ML IV BOLUS
INTRAVENOUS | Status: DC | PRN
  Administered 2016-09-30: 200 mg via INTRAVENOUS

## 2016-09-30 MED ORDER — OXYMETAZOLINE HCL 0.05 % NA SOLN
NASAL | Status: DC | PRN
  Administered 2016-09-30: 1

## 2016-09-30 MED ORDER — LACTATED RINGERS IV SOLN
INTRAVENOUS | Status: DC
  Administered 2016-09-30 (×2): via INTRAVENOUS

## 2016-09-30 MED ORDER — OXYCODONE HCL 5 MG PO TABS
ORAL_TABLET | ORAL | Status: AC
  Filled 2016-09-30: qty 1

## 2016-09-30 MED ORDER — ROCURONIUM BROMIDE 10 MG/ML (PF) SYRINGE
PREFILLED_SYRINGE | INTRAVENOUS | Status: AC
  Filled 2016-09-30: qty 10

## 2016-09-30 MED ORDER — SUGAMMADEX SODIUM 500 MG/5ML IV SOLN
INTRAVENOUS | Status: AC
  Filled 2016-09-30: qty 5

## 2016-09-30 MED ORDER — CEFAZOLIN SODIUM 1 G IJ SOLR
INTRAMUSCULAR | Status: AC
  Filled 2016-09-30: qty 20

## 2016-09-30 MED ORDER — FENTANYL CITRATE (PF) 100 MCG/2ML IJ SOLN
50.0000 ug | INTRAMUSCULAR | Status: DC | PRN
  Administered 2016-09-30: 100 ug via INTRAVENOUS

## 2016-09-30 MED ORDER — ONDANSETRON HCL 4 MG/2ML IJ SOLN
INTRAMUSCULAR | Status: DC | PRN
  Administered 2016-09-30: 4 mg via INTRAVENOUS

## 2016-09-30 MED ORDER — SCOPOLAMINE 1 MG/3DAYS TD PT72: 1.0000 | MEDICATED_PATCH | Freq: Once | TRANSDERMAL | Status: DC | PRN

## 2016-09-30 MED ORDER — LIDOCAINE 2% (20 MG/ML) 5 ML SYRINGE
INTRAMUSCULAR | Status: AC
  Filled 2016-09-30: qty 5

## 2016-09-30 MED ORDER — PROPOFOL 10 MG/ML IV BOLUS
INTRAVENOUS | Status: AC
  Filled 2016-09-30: qty 20

## 2016-09-30 MED ORDER — SUGAMMADEX SODIUM 500 MG/5ML IV SOLN
INTRAVENOUS | Status: DC | PRN
  Administered 2016-09-30: 300 mg via INTRAVENOUS

## 2016-09-30 MED ORDER — OXYCODONE-ACETAMINOPHEN 5-325 MG PO TABS: 1.0000 | ORAL_TABLET | ORAL | 0 refills | Status: AC | PRN

## 2016-09-30 MED ORDER — SUGAMMADEX SODIUM 200 MG/2ML IV SOLN
INTRAVENOUS | Status: AC
  Filled 2016-09-30: qty 2

## 2016-09-30 MED ORDER — MIDAZOLAM HCL 2 MG/2ML IJ SOLN
INTRAMUSCULAR | Status: AC
  Filled 2016-09-30: qty 2

## 2016-09-30 MED ORDER — ROCURONIUM BROMIDE 100 MG/10ML IV SOLN
INTRAVENOUS | Status: DC | PRN
  Administered 2016-09-30: 30 mg via INTRAVENOUS

## 2016-09-30 MED ORDER — ONDANSETRON HCL 4 MG/2ML IJ SOLN
INTRAMUSCULAR | Status: AC
  Filled 2016-09-30: qty 2

## 2016-09-30 MED ORDER — ARTIFICIAL TEARS OP OINT
TOPICAL_OINTMENT | OPHTHALMIC | Status: AC
  Filled 2016-09-30: qty 7

## 2016-09-30 MED ORDER — MIDAZOLAM HCL 2 MG/2ML IJ SOLN
1.0000 mg | INTRAMUSCULAR | Status: DC | PRN
  Administered 2016-09-30: 2 mg via INTRAVENOUS

## 2016-09-30 MED ORDER — FENTANYL CITRATE (PF) 100 MCG/2ML IJ SOLN
25.0000 ug | INTRAMUSCULAR | Status: DC | PRN
  Administered 2016-09-30: 50 ug via INTRAVENOUS

## 2016-09-30 MED ORDER — CEFAZOLIN SODIUM-DEXTROSE 2-3 GM-% IV SOLR
INTRAVENOUS | Status: DC | PRN
  Administered 2016-09-30: 2 g via INTRAVENOUS

## 2016-09-30 MED ORDER — SUCCINYLCHOLINE CHLORIDE 200 MG/10ML IV SOSY
PREFILLED_SYRINGE | INTRAVENOUS | Status: AC
  Filled 2016-09-30: qty 10

## 2016-09-30 MED ORDER — SUCCINYLCHOLINE CHLORIDE 20 MG/ML IJ SOLN
INTRAMUSCULAR | Status: DC | PRN
  Administered 2016-09-30: 120 mg via INTRAVENOUS

## 2016-09-30 MED ORDER — ONDANSETRON HCL 4 MG/2ML IJ SOLN: 4.0000 mg | Freq: Once | INTRAMUSCULAR | Status: DC | PRN

## 2016-09-30 MED ORDER — 0.9 % SODIUM CHLORIDE (POUR BTL) OPTIME
TOPICAL | Status: DC | PRN
  Administered 2016-09-30: 1000 mL

## 2016-09-30 MED ORDER — OXYCODONE HCL 5 MG PO TABS
5.0000 mg | ORAL_TABLET | Freq: Once | ORAL | Status: AC
  Administered 2016-09-30: 5 mg via ORAL

## 2016-09-30 MED ORDER — LIDOCAINE HCL (CARDIAC) 20 MG/ML IV SOLN
INTRAVENOUS | Status: DC | PRN
  Administered 2016-09-30: 50 mg via INTRAVENOUS

## 2016-09-30 MED ORDER — DEXAMETHASONE SODIUM PHOSPHATE 4 MG/ML IJ SOLN
INTRAMUSCULAR | Status: DC | PRN
  Administered 2016-09-30: 10 mg via INTRAVENOUS

## 2016-09-30 MED ORDER — AMOXICILLIN 875 MG PO TABS: 875.0000 mg | ORAL_TABLET | Freq: Two times a day (BID) | ORAL | 0 refills | Status: AC

## 2016-09-30 SURGICAL SUPPLY — 40 items
ATTRACTOMAT 16X20 MAGNETIC DRP (DRAPES) IMPLANT
BLADE SURG 15 STRL LF DISP TIS (BLADE) IMPLANT
BLADE SURG 15 STRL SS (BLADE)
CANISTER SUCT 1200ML W/VALVE (MISCELLANEOUS) ×4 IMPLANT
CATH SINUS GUIDE F-70 (CATHETERS) IMPLANT
COAGULATOR SUCT 6 FR SWTCH (ELECTROSURGICAL)
COAGULATOR SUCT 8FR VV (MISCELLANEOUS) ×4 IMPLANT
COAGULATOR SUCT SWTCH 10FR 6 (ELECTROSURGICAL) IMPLANT
DECANTER SPIKE VIAL GLASS SM (MISCELLANEOUS) IMPLANT
DRSG NASOPORE 8CM (GAUZE/BANDAGES/DRESSINGS) IMPLANT
DRSG TELFA 3X8 NADH (GAUZE/BANDAGES/DRESSINGS) IMPLANT
ELECT REM PT RETURN 9FT ADLT (ELECTROSURGICAL) ×4
ELECTRODE REM PT RTRN 9FT ADLT (ELECTROSURGICAL) ×2 IMPLANT
GLOVE BIO SURGEON STRL SZ7.5 (GLOVE) ×4 IMPLANT
GOWN STRL REUS W/ TWL LRG LVL3 (GOWN DISPOSABLE) ×4 IMPLANT
GOWN STRL REUS W/TWL LRG LVL3 (GOWN DISPOSABLE) ×8
IV SET EXT 30 76VOL 4 MALE LL (IV SETS) IMPLANT
MARKER SKIN DUAL TIP RULER LAB (MISCELLANEOUS) IMPLANT
NDL HYPO 25X1 1.5 SAFETY (NEEDLE) ×2 IMPLANT
NEEDLE HYPO 25X1 1.5 SAFETY (NEEDLE) ×4 IMPLANT
NS IRRIG 1000ML POUR BTL (IV SOLUTION) ×4 IMPLANT
PACK BASIN DAY SURGERY FS (CUSTOM PROCEDURE TRAY) ×4 IMPLANT
PACK ENT DAY SURGERY (CUSTOM PROCEDURE TRAY) ×4 IMPLANT
PAD DRESSING TELFA 3X8 NADH (GAUZE/BANDAGES/DRESSINGS) IMPLANT
SCRUB TECHNI CARE SURGICAL (MISCELLANEOUS) IMPLANT
SLEEVE SCD COMPRESS KNEE MED (MISCELLANEOUS) IMPLANT
SOLUTION BUTLER CLEAR DIP (MISCELLANEOUS) ×8 IMPLANT
SPLINT NASAL AIRWAY SILICONE (MISCELLANEOUS) IMPLANT
SPONGE GAUZE 2X2 8PLY STER LF (GAUZE/BANDAGES/DRESSINGS) ×1
SPONGE GAUZE 2X2 8PLY STRL LF (GAUZE/BANDAGES/DRESSINGS) ×3 IMPLANT
SPONGE NEURO XRAY DETECT 1X3 (DISPOSABLE) ×4 IMPLANT
SUT CHROMIC 4 0 P 3 18 (SUTURE) ×4 IMPLANT
SUT PLAIN 4 0 ~~LOC~~ 1 (SUTURE) ×4 IMPLANT
SUT PROLENE 3 0 PS 2 (SUTURE) ×4 IMPLANT
SUT VIC AB 4-0 P-3 18XBRD (SUTURE) IMPLANT
SUT VIC AB 4-0 P3 18 (SUTURE)
TOWEL OR 17X24 6PK STRL BLUE (TOWEL DISPOSABLE) ×4 IMPLANT
TUBE SALEM SUMP 12R W/ARV (TUBING) IMPLANT
TUBE SALEM SUMP 16 FR W/ARV (TUBING) ×4 IMPLANT
YANKAUER SUCT BULB TIP NO VENT (SUCTIONS) ×4 IMPLANT

## 2016-09-30 NOTE — Transfer of Care (Signed)
Immediate Anesthesia Transfer of Care Note  Patient: [name]  Procedure(s) Performed: Procedure(s) with comments: NASAL SEPTOPLASTY WITH TURBINATE REDUCTION (Bilateral) - NASAL SEPTOPLASTY WITH TURBINATE REDUCTION RIGHT POLYPECTOMY NASAL (Right) - RIGHT POLYPECTOMY NASAL  Patient Location: PACU  Anesthesia Type:General  Level of Consciousness: awake and alert   Airway & Oxygen Therapy: Patient Spontanous Breathing and Patient connected to face mask oxygen  Post-op Assessment: Report given to RN and Post -op Vital signs reviewed and stable  Post vital signs: Reviewed and stable  Last Vitals:  Vitals:   09/30/16 0953  BP: (!) 154/80  Pulse: 73  Resp: 18  Temp: 36.4 C    Last Pain:  Vitals:   09/30/16 0953  TempSrc: Oral  PainSc: 8       Patients Stated Pain Goal: 4 ([phone])  Complications: No apparent anesthesia complications

## 2016-09-30 NOTE — Anesthesia Postprocedure Evaluation (Signed)
Anesthesia Post Note  Patient: [name]  Procedure(s) Performed: Procedure(s) (LRB): NASAL SEPTOPLASTY WITH TURBINATE REDUCTION (Bilateral) RIGHT POLYPECTOMY NASAL (Right)  Patient location during evaluation: PACU Anesthesia Type: General Level of consciousness: awake and alert Pain management: pain level controlled Vital Signs Assessment: post-procedure vital signs reviewed and stable Respiratory status: spontaneous breathing, nonlabored ventilation, respiratory function stable and patient connected to nasal cannula oxygen Cardiovascular status: blood pressure returned to baseline and stable Postop Assessment: no signs of nausea or vomiting Anesthetic complications: no    Last Vitals:  Vitals:   09/30/16 1215 09/30/16 1230  BP: 126/83 130/74  Pulse: 72 70  Resp: 12 15  Temp:      Last Pain:  Vitals:   09/30/16 1230  TempSrc:   PainSc: 3                  Tiajuana Amass

## 2016-09-30 NOTE — H&P (Signed)
Cc: Chronic nasal obstruction  HPI: The patient is a 65 year old male who returns today for his follow-up evaluation.  The patient has a history of chronic nasal obstruction, secondary to nasal mucosal congestion, bilateral inferior turbinate hypertrophy and right nasal polyposis.  He was previously treated with systemic and topical steroids.  He is also on OTC decongestant and antihistamines.  He recently underwent a paranasal sinus CT scan.  The CT showed a large right posterior nasal cavity polyp, extending into the nasopharynx.  In addition, bilateral severe inferior turbinate hypertrophy was also noted.  The patient returns today reporting persistent bilateral nasal obstruction.  He denies any recent sinusitis.  He has no significant facial pain, nasal drainage, fever, or visual change.  Exam: The nasal cavities were decongested and anesthetised with a combination of oxymetazoline and 4% lidocaine solution.  The flexible scope was inserted into the right nasal cavity.  Endoscopy of the inferior and middle meatus was performed.  Moderately edematous mucosa was noted. A large polyp was noted within the right posterior nasal cavity, nearly completely obstructing the entire right nasal cavity.  Turbinates were hypertrophied but without mass.  Incomplete response to decongestion.  The procedure was repeated on the contralateral side.  No polyp on the left. The patient tolerated the procedure well.  Instructions were given to avoid eating or drinking for 2 hours.    Assessment: 1.  Persistent chronic rhinitis with nasal mucosal congestion, bilateral severe inferior turbinate hypertrophy, and right nasal polyposis.  2.  His right nasal polyp completely obstructs the right nasal cavity.  The polyp extends into the nasopharynx.    Plan: 1.  The nasal endoscopy findings and the CT images are reviewed with the patient.  2.  The patient should continue with his Flonase nasal spray and daily Zyrtec.  3.  In  light of his persistent symptoms, he will benefit from undergoing surgical intervention with removal of his right nasal polyps and partial resection of his bilateral inferior turbinates.   4.  The patient would like to proceed with the procedures.

## 2016-09-30 NOTE — Anesthesia Procedure Notes (Signed)
Procedure Name: Intubation Date/Time: 09/30/2016 10:31 AM Performed by: Lieutenant Diego Pre-anesthesia Checklist: Patient identified, Emergency Drugs available, Suction available and Patient being monitored Patient Re-evaluated:Patient Re-evaluated prior to inductionOxygen Delivery Method: Circle system utilized Preoxygenation: Pre-oxygenation with 100% oxygen Intubation Type: IV induction Ventilation: Mask ventilation without difficulty Laryngoscope Size: Miller and 2 Grade View: Grade II Tube type: Oral Number of attempts: 1 Airway Equipment and Method: Stylet and Oral airway Placement Confirmation: ETT inserted through vocal cords under direct vision,  positive ETCO2 and breath sounds checked- equal and bilateral Secured at: 24 cm Tube secured with: Tape Dental Injury: Teeth and Oropharynx as per pre-operative assessment  Difficulty Due To: Difficult Airway- due to anterior larynx and Difficult Airway- due to dentition

## 2016-09-30 NOTE — Op Note (Signed)
DATE OF PROCEDURE: 09/30/2016  OPERATIVE REPORT   SURGEON: Leta Baptist, MD   PREOPERATIVE DIAGNOSES:  1. Chronic nasal obstruction.  2. Bilateral inferior turbinate hypertrophy.  3. Right nasal polyps  POSTOPERATIVE DIAGNOSES:  1. Chronic nasal obstruction.  2. Bilateral inferior turbinate hypertrophy.  3. Right nasal polyps  PROCEDURE PERFORMED:  1. Bilateral partial inferior turbinate resection.  2. Endoscopic right nasal polypectomy  ANESTHESIA: General endotracheal tube anesthesia.   COMPLICATIONS: None.   ESTIMATED BLOOD LOSS: Minimal.   INDICATION FOR PROCEDURE :[name] is a 65 y.o. male with a history of chronic nasal obstruction. The patient was treated with antihistamine, decongestant, steroid nasal spray, and systemic steroids. However, the patient continues to be symptomatic. On examination, the patient was noted to have large nasal polyposis in the right nasal cavity and bilateral severe inferior turbinate hypertrophy, causing significant nasal obstruction. Based on the above findings, the decision was made for the patient to undergo the above-stated procedure. The risks, benefits, alternatives, and details of the procedure were discussed with the patient. Questions were invited and answered. Informed consent was obtained.   DESCRIPTION: The patient was taken to the operating room and placed supine on the operating table. General endotracheal tube anesthesia was administered by the anesthesiologist. The patient was positioned and prepped and draped in a standard fashion for nasal surgery. Pledgets soaked with Afrin were placed in both nasal cavities. The pledgets were subsequently removed. Examination of the nasal cavities revealed bilateral severe inferior turbinate hypertrophy and large polyps within the right nasal cavity.   Attention was first focused on the nasal polyps. Using a 0 endoscope, the right nasal cavity was examined. The nasal polyps were identified and  removed in a piecemeal fashion using a combination of microdebrider and Blakesley forceps. The specimens were sent to the pathology department for permanent histologic identification. All visible polyps were removed.  Attention was then focused on the inferior turbinates.The inferior one-half of each inferior turbinate was crossclamped with a straight Kelly clamp. The inferior one-half of each inferior turbinate was then resected with a pair of cross cutting scissors. Hemostasis was achieved with suction electrocautery, under direct visual guidance of the zero-degree endoscope. Good hemostasis was achieved. The care of the patient was turned over to the anesthesiologist. The patient was awakened from anesthesia without difficulty. The patient was extubated and transferred to the recovery room in good condition.   OPERATIVE FINDINGS: Bilateral inferior turbinate hypertrophy, right nasal polyps.  SPECIMEN: Right nasal polyps  FOLLOWUP CARE: The patient will be discharged home once he is awake and alert. The patient will be placed on Percocet. p.r.n. pain, and amoxicillin 875 mg p.o. b.i.d. for 5 days. The patient will follow up in my office in approximately 1 week.   Leta Baptist, MD

## 2016-09-30 NOTE — Discharge Instructions (Addendum)
POSTOPERATIVE INSTRUCTIONS FOR PATIENTS HAVING NASAL OR SINUS OPERATIONS ACTIVITY: Restrict activity at home for the first two days, resting as much as possible. Light activity is best. You may usually return to work within a week. You should refrain from nose blowing, strenuous activity, or heavy lifting greater than 20lbs for a total of three weeks after your operation.  If sneezing cannot be avoided, sneeze with your mouth open. DISCOMFORT: You may experience a dull headache and pressure along with nasal congestion and discharge. These symptoms may be worse during the first week after the operation but may last as long as two to four weeks.  Please take Tylenol or the pain medication that has been prescribed for you. Do not take aspirin or aspirin containing medications since they may cause bleeding.  You may experience symptoms of post nasal drainage, nasal congestion, headaches and fatigue for two or three months after your operation.  BLEEDING: You may have some blood tinged nasal drainage for approximately two weeks after the operation.  The discharge will be worse for the first week.  Please call our office at [phone] or go to the nearest hospital emergency room if you experience any of the following: heavy, bright red blood from your nose or mouth that lasts longer than ten minutes or coughing up or vomiting bright red blood or blood clots. GENERAL CONSIDERATIONS: 1. A gauze dressing will be placed on your upper lip to absorb any drainage after the operation. You may need to change this several times a day.  If you do not have very much drainage, you may remove the dressing.  Remember that you may gently wipe your nose with a tissue and sniff in, but DO NOT blow your nose. 2. Please keep all of your postoperative appointments.  Your final results after the operation will depend on proper follow-up.  The initial visit is usually four to seven days after the operation.  During this visit, the  remaining nasal packing and internal septal splints will be removed.  Your nasal and sinus cavities will be cleaned.  During the second visit, your nasal and sinus cavities will be cleaned again. Have someone drive you to your first two postoperative appointments. We suggest that you take your prescribed pain medication about  hour prior to each of these two appointments.  3. How you care for your nose after the operation will influence the results that you obtain.  You should follow all directions, take your medication as prescribed, and call our office [phone] with any problems or questions. 4. You may be more comfortable sleeping with your head elevated on two pillows. 5. Do not take any medications that we have not prescribed or recommended. WARNING SIGNS: if any of the following should occur, please call our office: 1. Bright red bleeding which lasts more than 10 minutes. 2. Persistent fever greater than 102F. 3. Persistent vomiting. 4. Severe and constant pain that is not relieved by prescribed pain medication. 5. Trauma to the nose. Rash or unusual side effects from any medicines.   Post Anesthesia Home Care Instructions  Activity: Get plenty of rest for the remainder of the day. A responsible adult should stay with you for 24 hours following the procedure.  For the next 24 hours, DO NOT: -Drive a car -Paediatric nurse -Drink alcoholic beverages -Take any medication unless instructed by your physician -Make any legal decisions or sign important papers.  Meals: Start with liquid foods such as gelatin or soup. Progress to regular  foods as tolerated. Avoid greasy, spicy, heavy foods. If nausea and/or vomiting occur, drink only clear liquids until the nausea and/or vomiting subsides. Call your physician if vomiting continues.  Special Instructions/Symptoms: Your throat may feel dry or sore from the anesthesia or the breathing tube placed in your throat during surgery. If this  causes discomfort, gargle with warm salt water. The discomfort should disappear within 24 hours.  If you had a scopolamine patch placed behind your ear for the management of post- operative nausea and/or vomiting:  1. The medication in the patch is effective for 72 hours, after which it should be removed.  Wrap patch in a tissue and discard in the trash. Wash hands thoroughly with soap and water. 2. You may remove the patch earlier than 72 hours if you experience unpleasant side effects which may include dry mouth, dizziness or visual disturbances. 3. Avoid touching the patch. Wash your hands with soap and water after contact with the patch.

## 2016-09-30 NOTE — Anesthesia Preprocedure Evaluation (Addendum)
Anesthesia Evaluation  Patient identified by MRN, date of birth, ID band Patient awake    Reviewed: Allergy & Precautions, NPO status , Patient's Chart, lab work & pertinent test results  History of Anesthesia Complications (+) history of anesthetic complications ("hard time waking up")  Airway Mallampati: III  TM Distance: <3 FB Neck ROM: Full    Dental  (+) Teeth Intact, Dental Advisory Given   Pulmonary sleep apnea and Continuous Positive Airway Pressure Ventilation ,    Pulmonary exam normal breath sounds clear to auscultation       Cardiovascular hypertension, Pt. on medications (-) angina(-) CAD, (-) Past MI and (-) CHF Normal cardiovascular exam Rhythm:Regular Rate:Normal     Neuro/Psych negative neurological ROS  negative psych ROS   GI/Hepatic negative GI ROS, Neg liver ROS,   Endo/Other  Obesity   Renal/GU negative Renal ROS     Musculoskeletal  (+) Arthritis , Osteoarthritis,    Abdominal   Peds  Hematology negative hematology ROS (+)   Anesthesia Other Findings Day of surgery medications reviewed with the patient.  Reproductive/Obstetrics                            Anesthesia Physical Anesthesia Plan  ASA: II  Anesthesia Plan: General   Post-op Pain Management:    Induction: Intravenous  Airway Management Planned: Oral ETT  Additional Equipment:   Intra-op Plan:   Post-operative Plan: Extubation in OR  Informed Consent: I have reviewed the patients History and Physical, chart, labs and discussed the procedure including the risks, benefits and alternatives for the proposed anesthesia with the patient or authorized representative who has indicated his/her understanding and acceptance.   Dental advisory given  Plan Discussed with: CRNA  Anesthesia Plan Comments: (Risks/benefits of general anesthesia discussed with patient including risk of damage to teeth, lips,  gum, and tongue, nausea/vomiting, allergic reactions to medications, and the possibility of heart attack, stroke and death.  All patient questions answered.  Patient wishes to proceed.)        Anesthesia Quick Evaluation

## 2016-10-01 ENCOUNTER — Encounter (HOSPITAL_BASED_OUTPATIENT_CLINIC_OR_DEPARTMENT_OTHER): Payer: Self-pay | Admitting: Otolaryngology

## 2016-10-15 DIAGNOSIS — D125 Benign neoplasm of sigmoid colon: Secondary | ICD-10-CM | POA: Diagnosis not present

## 2016-10-15 DIAGNOSIS — K621 Rectal polyp: Secondary | ICD-10-CM | POA: Diagnosis not present

## 2016-10-15 DIAGNOSIS — Z86010 Personal history of colonic polyps: Secondary | ICD-10-CM | POA: Diagnosis not present

## 2016-10-15 DIAGNOSIS — D120 Benign neoplasm of cecum: Secondary | ICD-10-CM | POA: Diagnosis not present

## 2017-04-13 DIAGNOSIS — R072 Precordial pain: Secondary | ICD-10-CM | POA: Diagnosis not present

## 2017-04-20 DIAGNOSIS — E782 Mixed hyperlipidemia: Secondary | ICD-10-CM | POA: Diagnosis not present

## 2017-04-20 DIAGNOSIS — I10 Essential (primary) hypertension: Secondary | ICD-10-CM | POA: Diagnosis not present

## 2017-04-20 DIAGNOSIS — G4733 Obstructive sleep apnea (adult) (pediatric): Secondary | ICD-10-CM | POA: Diagnosis not present

## 2017-04-20 DIAGNOSIS — R0789 Other chest pain: Secondary | ICD-10-CM | POA: Diagnosis not present

## 2017-04-21 DIAGNOSIS — G4733 Obstructive sleep apnea (adult) (pediatric): Secondary | ICD-10-CM | POA: Diagnosis not present

## 2017-04-21 DIAGNOSIS — I10 Essential (primary) hypertension: Secondary | ICD-10-CM | POA: Diagnosis not present

## 2017-04-21 DIAGNOSIS — E782 Mixed hyperlipidemia: Secondary | ICD-10-CM | POA: Diagnosis not present

## 2017-04-21 DIAGNOSIS — Z125 Encounter for screening for malignant neoplasm of prostate: Secondary | ICD-10-CM | POA: Diagnosis not present

## 2017-04-21 DIAGNOSIS — Z23 Encounter for immunization: Secondary | ICD-10-CM | POA: Diagnosis not present

## 2017-04-21 DIAGNOSIS — Z0000 Encounter for general adult medical examination without abnormal findings: Secondary | ICD-10-CM | POA: Diagnosis not present

## 2017-04-27 DIAGNOSIS — R0789 Other chest pain: Secondary | ICD-10-CM | POA: Diagnosis not present

## 2017-04-29 DIAGNOSIS — R0789 Other chest pain: Secondary | ICD-10-CM | POA: Diagnosis not present

## 2017-05-20 DIAGNOSIS — R0789 Other chest pain: Secondary | ICD-10-CM | POA: Diagnosis not present

## 2017-05-20 DIAGNOSIS — I10 Essential (primary) hypertension: Secondary | ICD-10-CM | POA: Diagnosis not present

## 2017-05-20 DIAGNOSIS — K219 Gastro-esophageal reflux disease without esophagitis: Secondary | ICD-10-CM | POA: Diagnosis not present

## 2017-05-20 DIAGNOSIS — E782 Mixed hyperlipidemia: Secondary | ICD-10-CM | POA: Diagnosis not present

## 2017-05-27 DIAGNOSIS — E782 Mixed hyperlipidemia: Secondary | ICD-10-CM | POA: Diagnosis not present

## 2017-05-27 DIAGNOSIS — N200 Calculus of kidney: Secondary | ICD-10-CM | POA: Diagnosis not present

## 2017-05-27 DIAGNOSIS — R7303 Prediabetes: Secondary | ICD-10-CM | POA: Diagnosis not present

## 2017-05-27 DIAGNOSIS — E876 Hypokalemia: Secondary | ICD-10-CM | POA: Diagnosis not present

## 2017-08-17 IMAGING — CT CT MAXILLOFACIAL W/O CM
1 series · 15 of 30 positions shown, 19 images · non-contrast
Comparison: None.

CLINICAL DATA: Nasal polyp on the right.

EXAM:
CT MAXILLOFACIAL WITHOUT CONTRAST
TECHNIQUE: Multidetector CT imaging of the maxillofacial structures was
performed. Multiplanar CT image reconstructions were also generated.
A small metallic BB was placed on the right temple in order to
reliably differentiate right from left.

[Series 4: soft tissue · axial · 0.56mm/px · z∈[-270,-98]mm · 15 of 186 slices shown, 19 images]
[im 7/186  brain]
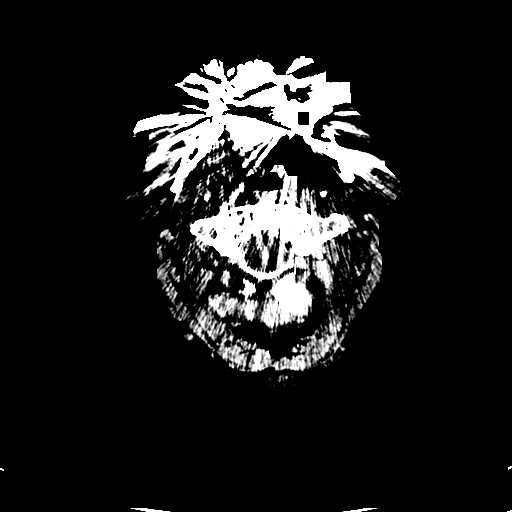
[im 7/186  bone]
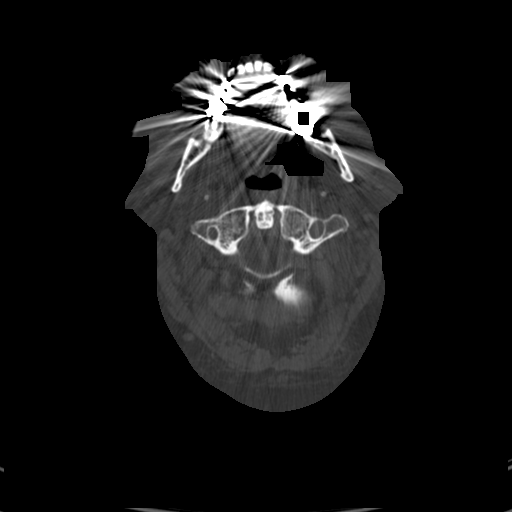
[im 20/186  bone]
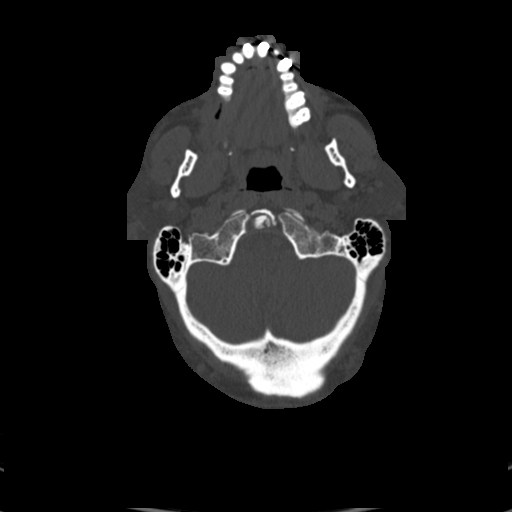
[im 32/186  bone]
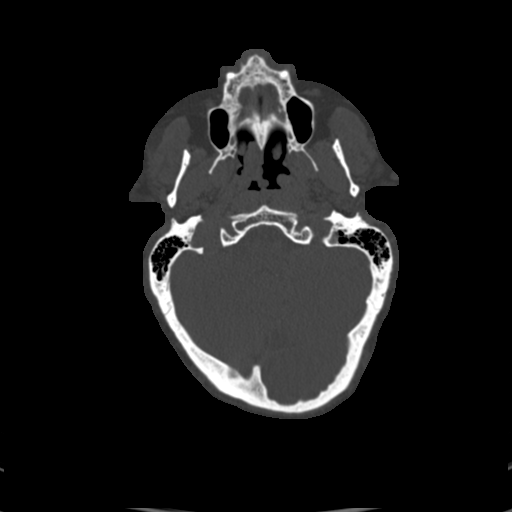
[im 45/186  bone]
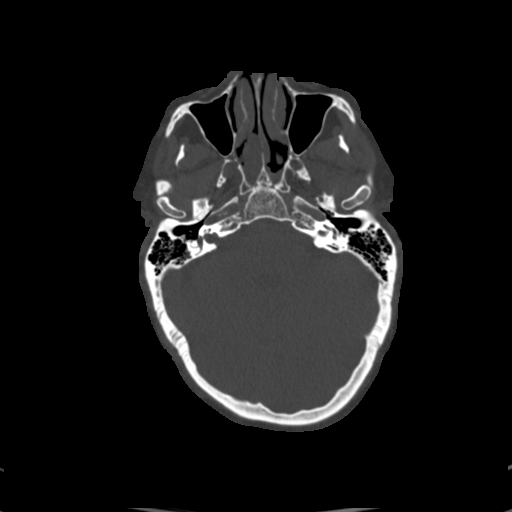
[im 58/186  brain]
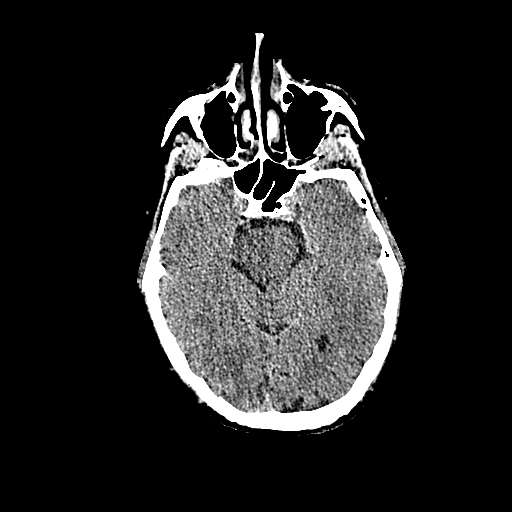
[im 58/186  bone]
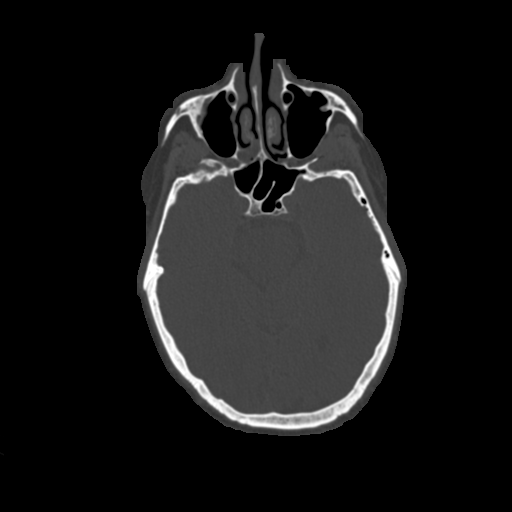
[im 71/186  bone]
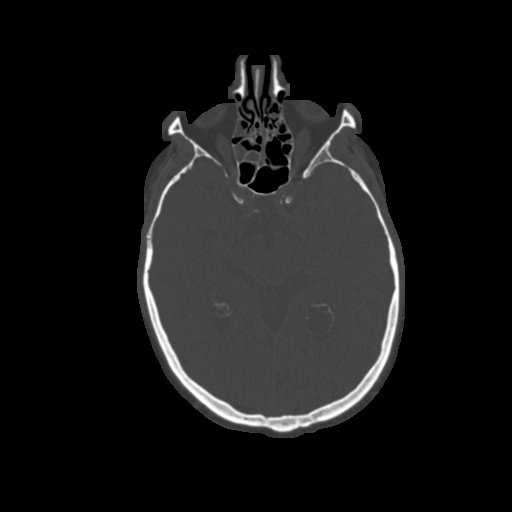
[im 83/186  bone]
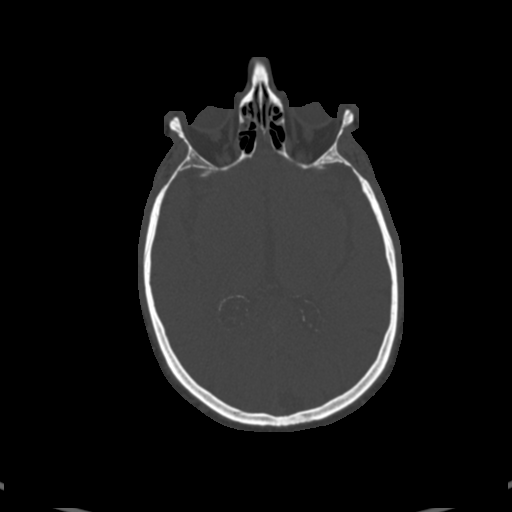
[im 96/186  bone]
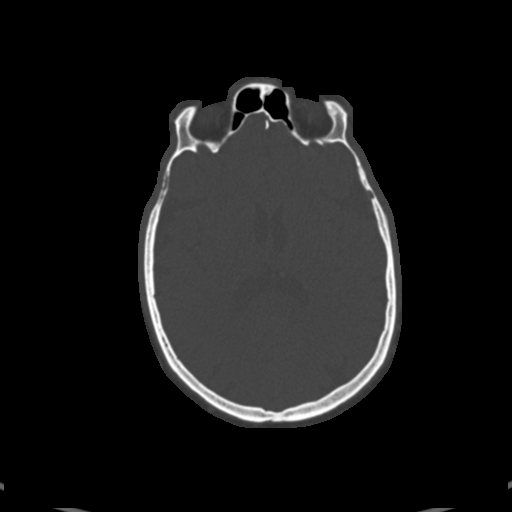
[im 103/186  brain]
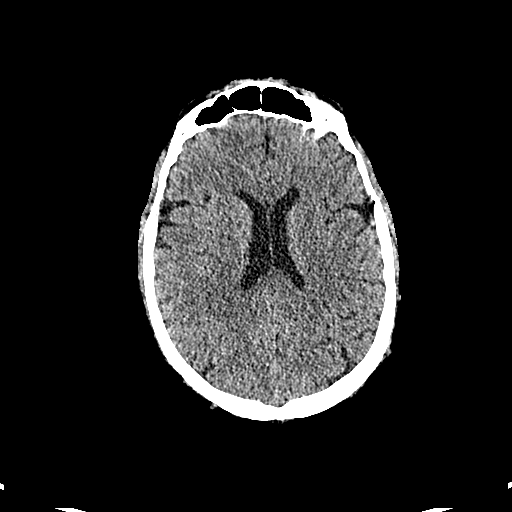
[im 103/186  bone]
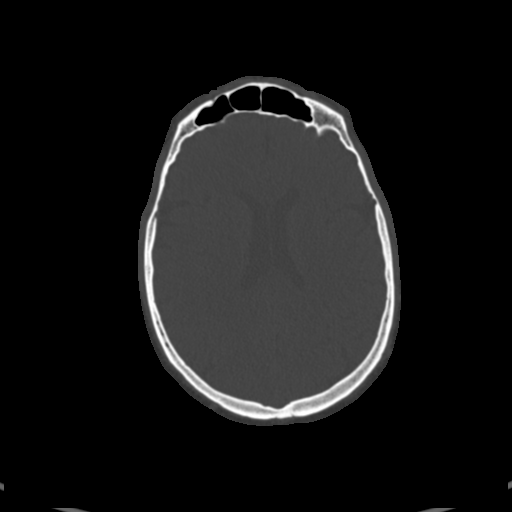
[im 115/186  bone]
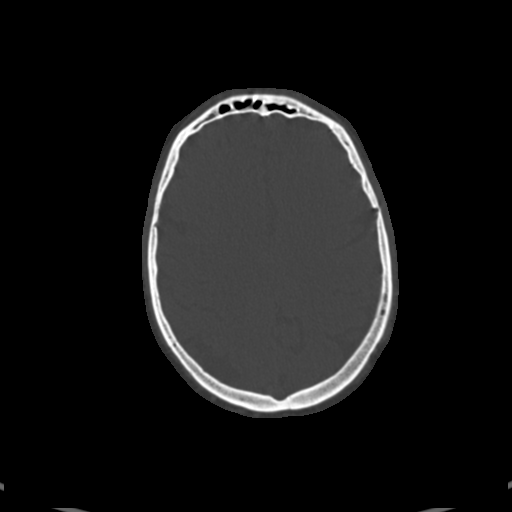
[im 128/186  bone]
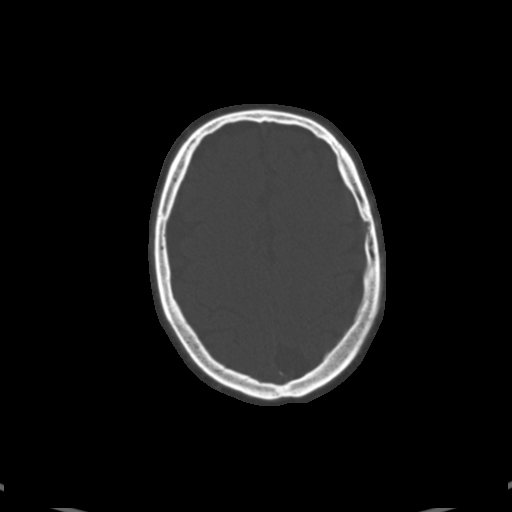
[im 141/186  bone]
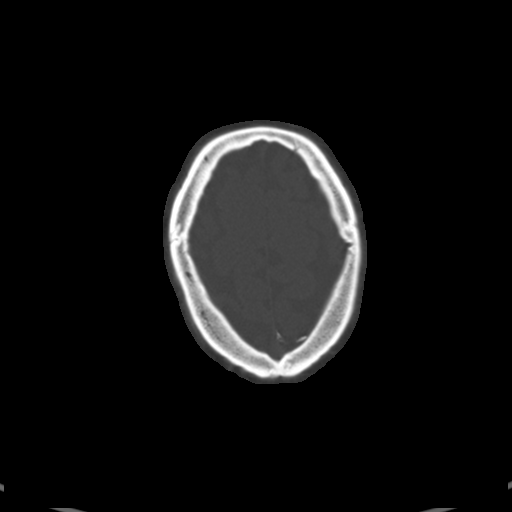
[im 154/186  brain]
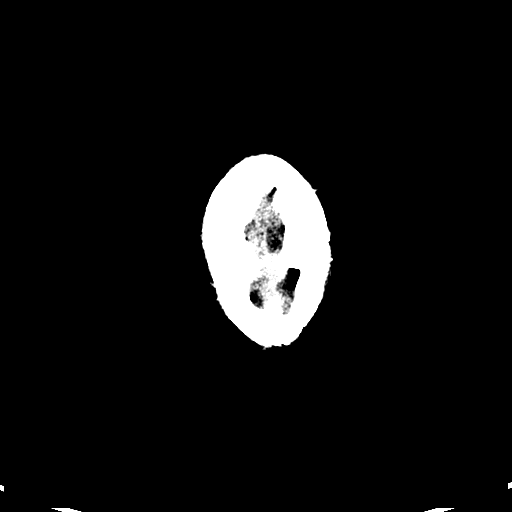
[im 154/186  bone]
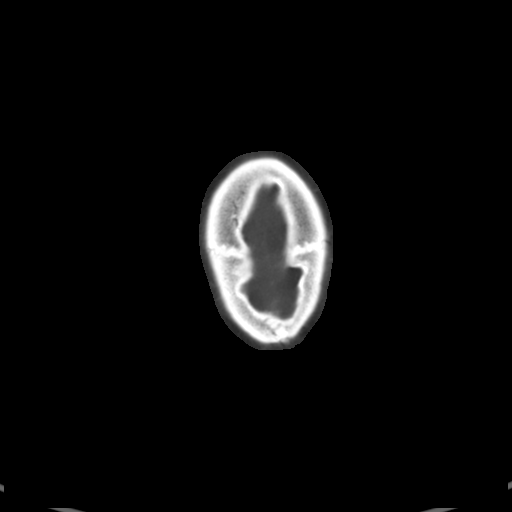
[im 166/186  bone]
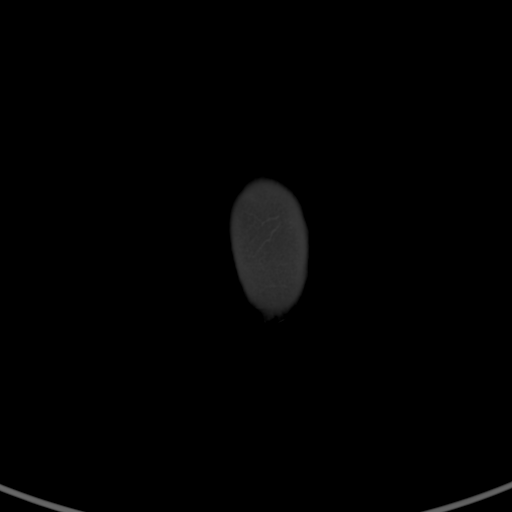
[im 179/186  bone]
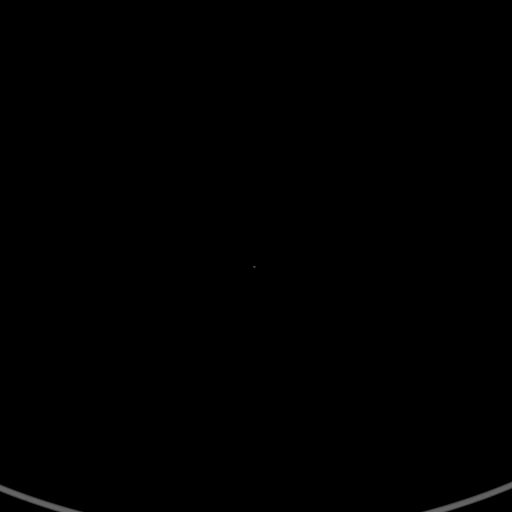

[15 of 30 positions shown; findings below may reference images not displayed]

FINDINGS: Osseous: No fracture or destructive process.

Orbits: Negative

Sinuses: There is a polypoid soft tissue mass in the posterior right
nasal cavity, herniating into the nasopharynx where there is visible
lobulation. Appearance consistent with history of nasal polyp. No
destructive changes to suggest malignancy. The polyp and neighboring
mucosal thickening effaces the right sphenoid ethmoidal recess.

Patchy mucosal thickening in bilateral ethmoid air cells. Mucosal
disease effaces the right frontal ethmoidal recess. No sinus fluid
levels.

Bilateral maxillary infundibula are patent. Accessory ostium present
bilaterally.

Midline nasal septum in this patient with history of septoplasty.
There is paradoxical rotation of the right middle turbinate with
mild concha bullosa on the left. Symmetric olfactory recess depth.
Intact medial orbital walls.

Soft tissues: No acute finding

Limited intracranial: No significant finding. Extensive granulomas
in the choroid plexus.
IMPRESSION: 1. Large polyp in the right posterior nasal cavity herniating into
the nasopharynx. No aggressive/destructive findings.
2. Polyp and mucosal thickening effaces the right sphenoid ethmoidal
recess.
3. Patchy inflammatory mucosal disease in bilateral ethmoid and
right frontal sinuses.

## 2018-06-02 DIAGNOSIS — Z0000 Encounter for general adult medical examination without abnormal findings: Secondary | ICD-10-CM | POA: Diagnosis not present

## 2018-06-02 DIAGNOSIS — E782 Mixed hyperlipidemia: Secondary | ICD-10-CM | POA: Diagnosis not present

## 2018-06-02 DIAGNOSIS — R7303 Prediabetes: Secondary | ICD-10-CM | POA: Diagnosis not present

## 2018-06-02 DIAGNOSIS — Z125 Encounter for screening for malignant neoplasm of prostate: Secondary | ICD-10-CM | POA: Diagnosis not present

## 2018-06-09 DIAGNOSIS — E782 Mixed hyperlipidemia: Secondary | ICD-10-CM | POA: Diagnosis not present

## 2018-06-09 DIAGNOSIS — I10 Essential (primary) hypertension: Secondary | ICD-10-CM | POA: Diagnosis not present

## 2018-06-09 DIAGNOSIS — Z0000 Encounter for general adult medical examination without abnormal findings: Secondary | ICD-10-CM | POA: Diagnosis not present

## 2018-06-09 DIAGNOSIS — R413 Other amnesia: Secondary | ICD-10-CM | POA: Diagnosis not present

## 2018-08-02 DIAGNOSIS — L821 Other seborrheic keratosis: Secondary | ICD-10-CM | POA: Diagnosis not present

## 2018-08-02 DIAGNOSIS — L918 Other hypertrophic disorders of the skin: Secondary | ICD-10-CM | POA: Diagnosis not present

## 2018-08-02 DIAGNOSIS — L720 Epidermal cyst: Secondary | ICD-10-CM | POA: Diagnosis not present

## 2018-08-02 DIAGNOSIS — L820 Inflamed seborrheic keratosis: Secondary | ICD-10-CM | POA: Diagnosis not present

## 2018-08-09 DIAGNOSIS — L720 Epidermal cyst: Secondary | ICD-10-CM | POA: Diagnosis not present

## 2018-08-23 DIAGNOSIS — L720 Epidermal cyst: Secondary | ICD-10-CM | POA: Diagnosis not present

## 2018-09-13 DIAGNOSIS — M10071 Idiopathic gout, right ankle and foot: Secondary | ICD-10-CM | POA: Diagnosis not present

## 2018-09-13 DIAGNOSIS — R52 Pain, unspecified: Secondary | ICD-10-CM | POA: Diagnosis not present

## 2018-11-19 DIAGNOSIS — I10 Essential (primary) hypertension: Secondary | ICD-10-CM | POA: Diagnosis not present

## 2018-11-19 DIAGNOSIS — E782 Mixed hyperlipidemia: Secondary | ICD-10-CM | POA: Diagnosis not present

## 2018-11-26 DIAGNOSIS — N200 Calculus of kidney: Secondary | ICD-10-CM | POA: Diagnosis not present

## 2018-11-26 DIAGNOSIS — E782 Mixed hyperlipidemia: Secondary | ICD-10-CM | POA: Diagnosis not present

## 2018-11-26 DIAGNOSIS — M791 Myalgia, unspecified site: Secondary | ICD-10-CM | POA: Diagnosis not present

## 2018-11-26 DIAGNOSIS — I10 Essential (primary) hypertension: Secondary | ICD-10-CM | POA: Diagnosis not present

## 2019-02-17 DIAGNOSIS — S46002A Unspecified injury of muscle(s) and tendon(s) of the rotator cuff of left shoulder, initial encounter: Secondary | ICD-10-CM | POA: Diagnosis not present

## 2019-02-21 DIAGNOSIS — M25512 Pain in left shoulder: Secondary | ICD-10-CM | POA: Diagnosis not present

## 2019-02-21 DIAGNOSIS — M7582 Other shoulder lesions, left shoulder: Secondary | ICD-10-CM | POA: Diagnosis not present

## 2019-02-21 DIAGNOSIS — M19012 Primary osteoarthritis, left shoulder: Secondary | ICD-10-CM | POA: Diagnosis not present

## 2019-06-02 DIAGNOSIS — M1711 Unilateral primary osteoarthritis, right knee: Secondary | ICD-10-CM | POA: Diagnosis not present

## 2019-06-02 DIAGNOSIS — R52 Pain, unspecified: Secondary | ICD-10-CM | POA: Diagnosis not present

## 2019-06-09 DIAGNOSIS — M5432 Sciatica, left side: Secondary | ICD-10-CM | POA: Diagnosis not present

## 2019-06-09 DIAGNOSIS — Z7189 Other specified counseling: Secondary | ICD-10-CM | POA: Diagnosis not present

## 2019-06-09 DIAGNOSIS — M25562 Pain in left knee: Secondary | ICD-10-CM | POA: Diagnosis not present

## 2019-06-23 DIAGNOSIS — Z7189 Other specified counseling: Secondary | ICD-10-CM | POA: Diagnosis not present

## 2019-06-23 DIAGNOSIS — E782 Mixed hyperlipidemia: Secondary | ICD-10-CM | POA: Diagnosis not present

## 2019-06-23 DIAGNOSIS — N200 Calculus of kidney: Secondary | ICD-10-CM | POA: Diagnosis not present

## 2019-06-23 DIAGNOSIS — Z0000 Encounter for general adult medical examination without abnormal findings: Secondary | ICD-10-CM | POA: Diagnosis not present

## 2019-06-23 DIAGNOSIS — I10 Essential (primary) hypertension: Secondary | ICD-10-CM | POA: Diagnosis not present

## 2019-06-23 DIAGNOSIS — Z23 Encounter for immunization: Secondary | ICD-10-CM | POA: Diagnosis not present

## 2019-06-30 DIAGNOSIS — Z0000 Encounter for general adult medical examination without abnormal findings: Secondary | ICD-10-CM | POA: Diagnosis not present

## 2019-06-30 DIAGNOSIS — I10 Essential (primary) hypertension: Secondary | ICD-10-CM | POA: Diagnosis not present

## 2019-06-30 DIAGNOSIS — R002 Palpitations: Secondary | ICD-10-CM | POA: Diagnosis not present

## 2019-06-30 DIAGNOSIS — E782 Mixed hyperlipidemia: Secondary | ICD-10-CM | POA: Diagnosis not present

## 2019-07-07 DIAGNOSIS — M1712 Unilateral primary osteoarthritis, left knee: Secondary | ICD-10-CM | POA: Diagnosis not present

## 2019-07-07 DIAGNOSIS — R52 Pain, unspecified: Secondary | ICD-10-CM | POA: Diagnosis not present

## 2019-07-07 DIAGNOSIS — M1711 Unilateral primary osteoarthritis, right knee: Secondary | ICD-10-CM | POA: Diagnosis not present

## 2019-07-21 DIAGNOSIS — M1711 Unilateral primary osteoarthritis, right knee: Secondary | ICD-10-CM | POA: Diagnosis not present

## 2019-07-28 DIAGNOSIS — M1711 Unilateral primary osteoarthritis, right knee: Secondary | ICD-10-CM | POA: Diagnosis not present

## 2019-08-04 DIAGNOSIS — M1711 Unilateral primary osteoarthritis, right knee: Secondary | ICD-10-CM | POA: Diagnosis not present

## 2019-11-30 DIAGNOSIS — R7303 Prediabetes: Secondary | ICD-10-CM | POA: Diagnosis not present

## 2019-11-30 DIAGNOSIS — M79602 Pain in left arm: Secondary | ICD-10-CM | POA: Diagnosis not present

## 2019-11-30 DIAGNOSIS — M5412 Radiculopathy, cervical region: Secondary | ICD-10-CM | POA: Diagnosis not present

## 2019-11-30 DIAGNOSIS — I10 Essential (primary) hypertension: Secondary | ICD-10-CM | POA: Diagnosis not present

## 2019-11-30 DIAGNOSIS — E782 Mixed hyperlipidemia: Secondary | ICD-10-CM | POA: Diagnosis not present

## 2019-12-21 DIAGNOSIS — M79602 Pain in left arm: Secondary | ICD-10-CM | POA: Diagnosis not present

## 2019-12-21 DIAGNOSIS — M50322 Other cervical disc degeneration at C5-C6 level: Secondary | ICD-10-CM | POA: Diagnosis not present

## 2019-12-21 DIAGNOSIS — M5412 Radiculopathy, cervical region: Secondary | ICD-10-CM | POA: Diagnosis not present

## 2019-12-27 ENCOUNTER — Other Ambulatory Visit: Payer: Self-pay | Admitting: Internal Medicine

## 2019-12-27 DIAGNOSIS — M5412 Radiculopathy, cervical region: Secondary | ICD-10-CM

## 2019-12-27 DIAGNOSIS — M5031 Other cervical disc degeneration,  high cervical region: Secondary | ICD-10-CM | POA: Diagnosis not present

## 2019-12-27 DIAGNOSIS — M4802 Spinal stenosis, cervical region: Secondary | ICD-10-CM | POA: Diagnosis not present

## 2019-12-27 DIAGNOSIS — M47812 Spondylosis without myelopathy or radiculopathy, cervical region: Secondary | ICD-10-CM | POA: Diagnosis not present

## 2019-12-27 DIAGNOSIS — M4312 Spondylolisthesis, cervical region: Secondary | ICD-10-CM | POA: Diagnosis not present

## 2020-01-04 DIAGNOSIS — M79602 Pain in left arm: Secondary | ICD-10-CM | POA: Diagnosis not present

## 2020-01-04 DIAGNOSIS — M5412 Radiculopathy, cervical region: Secondary | ICD-10-CM | POA: Diagnosis not present

## 2020-01-17 DIAGNOSIS — R29898 Other symptoms and signs involving the musculoskeletal system: Secondary | ICD-10-CM | POA: Diagnosis not present

## 2020-01-17 DIAGNOSIS — M5412 Radiculopathy, cervical region: Secondary | ICD-10-CM | POA: Diagnosis not present

## 2020-01-24 DIAGNOSIS — R29898 Other symptoms and signs involving the musculoskeletal system: Secondary | ICD-10-CM | POA: Diagnosis not present

## 2020-01-24 DIAGNOSIS — M5412 Radiculopathy, cervical region: Secondary | ICD-10-CM | POA: Diagnosis not present

## 2020-01-25 DIAGNOSIS — R29898 Other symptoms and signs involving the musculoskeletal system: Secondary | ICD-10-CM | POA: Diagnosis not present

## 2020-01-25 DIAGNOSIS — M5412 Radiculopathy, cervical region: Secondary | ICD-10-CM | POA: Diagnosis not present

## 2020-02-02 DIAGNOSIS — M5412 Radiculopathy, cervical region: Secondary | ICD-10-CM | POA: Diagnosis not present

## 2020-02-02 DIAGNOSIS — M4802 Spinal stenosis, cervical region: Secondary | ICD-10-CM | POA: Diagnosis not present

## 2020-02-02 DIAGNOSIS — M47812 Spondylosis without myelopathy or radiculopathy, cervical region: Secondary | ICD-10-CM | POA: Diagnosis not present

## 2020-02-02 DIAGNOSIS — M5030 Other cervical disc degeneration, unspecified cervical region: Secondary | ICD-10-CM | POA: Diagnosis not present

## 2020-02-17 DIAGNOSIS — R7303 Prediabetes: Secondary | ICD-10-CM | POA: Diagnosis not present

## 2020-02-17 DIAGNOSIS — I10 Essential (primary) hypertension: Secondary | ICD-10-CM | POA: Diagnosis not present

## 2020-02-17 DIAGNOSIS — E782 Mixed hyperlipidemia: Secondary | ICD-10-CM | POA: Diagnosis not present

## 2020-02-20 DIAGNOSIS — M4722 Other spondylosis with radiculopathy, cervical region: Secondary | ICD-10-CM | POA: Diagnosis not present

## 2020-02-20 DIAGNOSIS — M5030 Other cervical disc degeneration, unspecified cervical region: Secondary | ICD-10-CM | POA: Diagnosis not present

## 2020-02-20 DIAGNOSIS — M9971 Connective tissue and disc stenosis of intervertebral foramina of cervical region: Secondary | ICD-10-CM | POA: Diagnosis not present

## 2020-02-23 DIAGNOSIS — R7303 Prediabetes: Secondary | ICD-10-CM | POA: Diagnosis not present

## 2020-02-23 DIAGNOSIS — E782 Mixed hyperlipidemia: Secondary | ICD-10-CM | POA: Diagnosis not present

## 2020-02-23 DIAGNOSIS — I10 Essential (primary) hypertension: Secondary | ICD-10-CM | POA: Diagnosis not present

## 2020-03-22 DIAGNOSIS — M1711 Unilateral primary osteoarthritis, right knee: Secondary | ICD-10-CM | POA: Diagnosis not present

## 2020-03-22 DIAGNOSIS — M1712 Unilateral primary osteoarthritis, left knee: Secondary | ICD-10-CM | POA: Diagnosis not present

## 2020-03-26 DIAGNOSIS — M5412 Radiculopathy, cervical region: Secondary | ICD-10-CM | POA: Diagnosis not present

## 2020-03-26 DIAGNOSIS — M5030 Other cervical disc degeneration, unspecified cervical region: Secondary | ICD-10-CM | POA: Diagnosis not present

## 2020-03-26 DIAGNOSIS — M4802 Spinal stenosis, cervical region: Secondary | ICD-10-CM | POA: Diagnosis not present

## 2020-03-26 DIAGNOSIS — M47812 Spondylosis without myelopathy or radiculopathy, cervical region: Secondary | ICD-10-CM | POA: Diagnosis not present

## 2020-04-23 DIAGNOSIS — M170 Bilateral primary osteoarthritis of knee: Secondary | ICD-10-CM | POA: Diagnosis not present

## 2020-05-14 DIAGNOSIS — M1712 Unilateral primary osteoarthritis, left knee: Secondary | ICD-10-CM | POA: Diagnosis not present

## 2020-05-14 DIAGNOSIS — M25461 Effusion, right knee: Secondary | ICD-10-CM | POA: Diagnosis not present

## 2020-05-14 DIAGNOSIS — M1711 Unilateral primary osteoarthritis, right knee: Secondary | ICD-10-CM | POA: Diagnosis not present

## 2020-05-14 DIAGNOSIS — M25462 Effusion, left knee: Secondary | ICD-10-CM | POA: Diagnosis not present

## 2020-06-04 DIAGNOSIS — M25461 Effusion, right knee: Secondary | ICD-10-CM | POA: Diagnosis not present

## 2020-06-04 DIAGNOSIS — M1711 Unilateral primary osteoarthritis, right knee: Secondary | ICD-10-CM | POA: Diagnosis not present

## 2020-06-04 DIAGNOSIS — M23303 Other meniscus derangements, unspecified medial meniscus, right knee: Secondary | ICD-10-CM | POA: Diagnosis not present

## 2020-06-11 DIAGNOSIS — M1711 Unilateral primary osteoarthritis, right knee: Secondary | ICD-10-CM | POA: Diagnosis not present

## 2020-06-23 DIAGNOSIS — Z20822 Contact with and (suspected) exposure to covid-19: Secondary | ICD-10-CM | POA: Diagnosis not present

## 2020-06-23 DIAGNOSIS — Z03818 Encounter for observation for suspected exposure to other biological agents ruled out: Secondary | ICD-10-CM | POA: Diagnosis not present

## 2020-07-16 DIAGNOSIS — R600 Localized edema: Secondary | ICD-10-CM | POA: Diagnosis not present

## 2020-07-16 DIAGNOSIS — M25561 Pain in right knee: Secondary | ICD-10-CM | POA: Diagnosis not present

## 2020-07-16 DIAGNOSIS — M25562 Pain in left knee: Secondary | ICD-10-CM | POA: Diagnosis not present

## 2020-07-16 DIAGNOSIS — M1711 Unilateral primary osteoarthritis, right knee: Secondary | ICD-10-CM | POA: Diagnosis not present

## 2020-07-16 DIAGNOSIS — M1712 Unilateral primary osteoarthritis, left knee: Secondary | ICD-10-CM | POA: Diagnosis not present

## 2020-07-16 DIAGNOSIS — M7122 Synovial cyst of popliteal space [Baker], left knee: Secondary | ICD-10-CM | POA: Diagnosis not present
